# Patient Record
Sex: Female | Born: 1955
Health system: Southern US, Community
[De-identification: ages and names within clinical notes are randomized; demographics above are authoritative.]

## PROBLEM LIST (undated history)

## (undated) DIAGNOSIS — I10 Essential (primary) hypertension: Secondary | ICD-10-CM

## (undated) DIAGNOSIS — R002 Palpitations: Secondary | ICD-10-CM

## (undated) DIAGNOSIS — Z860100 Personal history of colon polyps, unspecified: Secondary | ICD-10-CM

## (undated) DIAGNOSIS — R011 Cardiac murmur, unspecified: Secondary | ICD-10-CM

## (undated) DIAGNOSIS — Z800 Family history of malignant neoplasm of digestive organs: Secondary | ICD-10-CM

## (undated) DIAGNOSIS — Z9289 Personal history of other medical treatment: Secondary | ICD-10-CM

## (undated) DIAGNOSIS — E785 Hyperlipidemia, unspecified: Secondary | ICD-10-CM

## (undated) DIAGNOSIS — J8410 Pulmonary fibrosis, unspecified: Secondary | ICD-10-CM

## (undated) DIAGNOSIS — E109 Type 1 diabetes mellitus without complications: Secondary | ICD-10-CM

## (undated) DIAGNOSIS — D509 Iron deficiency anemia, unspecified: Secondary | ICD-10-CM

## (undated) HISTORY — DX: Palpitations: R00.2

## (undated) HISTORY — PX: FACIAL COSMETIC SURGERY: SHX629

## (undated) HISTORY — DX: Cardiac murmur, unspecified: R01.1

## (undated) HISTORY — PX: COLONOSCOPY: SHX174

## (undated) HISTORY — DX: Iron deficiency anemia, unspecified: D50.9

## (undated) HISTORY — DX: Personal history of other medical treatment: Z92.89

## (undated) HISTORY — DX: Type 1 diabetes mellitus without complications: E10.9

## (undated) HISTORY — DX: Pulmonary fibrosis, unspecified: J84.10

## (undated) HISTORY — DX: Hyperlipidemia, unspecified: E78.5

## (undated) HISTORY — PX: POLYPECTOMY: SHX149

## (undated) HISTORY — DX: Family history of malignant neoplasm of digestive organs: Z80.0

## (undated) HISTORY — DX: Essential (primary) hypertension: I10

## (undated) HISTORY — PX: CHOLECYSTECTOMY: SHX55

## (undated) HISTORY — PX: BREAST SURGERY: SHX581

## (undated) HISTORY — DX: Personal history of colon polyps, unspecified: Z86.0100

---

## 2015-12-21 DIAGNOSIS — E785 Hyperlipidemia, unspecified: Secondary | ICD-10-CM | POA: Insufficient documentation

## 2015-12-21 DIAGNOSIS — I10 Essential (primary) hypertension: Secondary | ICD-10-CM

## 2015-12-21 DIAGNOSIS — E109 Type 1 diabetes mellitus without complications: Secondary | ICD-10-CM

## 2015-12-21 HISTORY — DX: Hyperlipidemia, unspecified: E78.5

## 2015-12-21 HISTORY — DX: Essential (primary) hypertension: I10

## 2015-12-21 HISTORY — DX: Type 1 diabetes mellitus without complications: E10.9

## 2016-01-07 DIAGNOSIS — E119 Type 2 diabetes mellitus without complications: Secondary | ICD-10-CM | POA: Diagnosis not present

## 2016-01-27 DIAGNOSIS — E109 Type 1 diabetes mellitus without complications: Secondary | ICD-10-CM | POA: Diagnosis not present

## 2016-03-18 DIAGNOSIS — E109 Type 1 diabetes mellitus without complications: Secondary | ICD-10-CM | POA: Diagnosis not present

## 2016-04-20 DIAGNOSIS — E109 Type 1 diabetes mellitus without complications: Secondary | ICD-10-CM | POA: Diagnosis not present

## 2016-04-20 DIAGNOSIS — Z794 Long term (current) use of insulin: Secondary | ICD-10-CM | POA: Diagnosis not present

## 2016-04-20 DIAGNOSIS — Z9641 Presence of insulin pump (external) (internal): Secondary | ICD-10-CM | POA: Diagnosis not present

## 2016-06-22 DIAGNOSIS — E109 Type 1 diabetes mellitus without complications: Secondary | ICD-10-CM | POA: Diagnosis not present

## 2016-06-22 DIAGNOSIS — E785 Hyperlipidemia, unspecified: Secondary | ICD-10-CM | POA: Diagnosis not present

## 2016-06-22 DIAGNOSIS — I10 Essential (primary) hypertension: Secondary | ICD-10-CM | POA: Diagnosis not present

## 2016-07-01 DIAGNOSIS — Z23 Encounter for immunization: Secondary | ICD-10-CM | POA: Diagnosis not present

## 2016-07-27 DIAGNOSIS — E109 Type 1 diabetes mellitus without complications: Secondary | ICD-10-CM | POA: Diagnosis not present

## 2016-07-27 DIAGNOSIS — Z9641 Presence of insulin pump (external) (internal): Secondary | ICD-10-CM | POA: Diagnosis not present

## 2016-07-27 DIAGNOSIS — Z794 Long term (current) use of insulin: Secondary | ICD-10-CM | POA: Diagnosis not present

## 2016-10-23 DIAGNOSIS — A088 Other specified intestinal infections: Secondary | ICD-10-CM | POA: Diagnosis not present

## 2016-11-28 DIAGNOSIS — Z8619 Personal history of other infectious and parasitic diseases: Secondary | ICD-10-CM | POA: Diagnosis not present

## 2016-11-28 DIAGNOSIS — R413 Other amnesia: Secondary | ICD-10-CM | POA: Diagnosis not present

## 2016-11-28 DIAGNOSIS — Z79899 Other long term (current) drug therapy: Secondary | ICD-10-CM | POA: Diagnosis not present

## 2016-11-28 DIAGNOSIS — E559 Vitamin D deficiency, unspecified: Secondary | ICD-10-CM | POA: Diagnosis not present

## 2016-11-28 DIAGNOSIS — E785 Hyperlipidemia, unspecified: Secondary | ICD-10-CM | POA: Diagnosis not present

## 2016-11-28 DIAGNOSIS — Z0001 Encounter for general adult medical examination with abnormal findings: Secondary | ICD-10-CM | POA: Diagnosis not present

## 2016-11-28 DIAGNOSIS — R110 Nausea: Secondary | ICD-10-CM | POA: Diagnosis not present

## 2016-12-02 DIAGNOSIS — Z794 Long term (current) use of insulin: Secondary | ICD-10-CM | POA: Diagnosis not present

## 2016-12-02 DIAGNOSIS — Z9641 Presence of insulin pump (external) (internal): Secondary | ICD-10-CM | POA: Diagnosis not present

## 2016-12-02 DIAGNOSIS — E109 Type 1 diabetes mellitus without complications: Secondary | ICD-10-CM | POA: Diagnosis not present

## 2016-12-05 DIAGNOSIS — Z9641 Presence of insulin pump (external) (internal): Secondary | ICD-10-CM | POA: Diagnosis not present

## 2016-12-05 DIAGNOSIS — Z794 Long term (current) use of insulin: Secondary | ICD-10-CM | POA: Diagnosis not present

## 2016-12-05 DIAGNOSIS — E109 Type 1 diabetes mellitus without complications: Secondary | ICD-10-CM | POA: Diagnosis not present

## 2016-12-05 DIAGNOSIS — Z8619 Personal history of other infectious and parasitic diseases: Secondary | ICD-10-CM | POA: Diagnosis not present

## 2016-12-07 DIAGNOSIS — Z1231 Encounter for screening mammogram for malignant neoplasm of breast: Secondary | ICD-10-CM | POA: Diagnosis not present

## 2016-12-19 DIAGNOSIS — I10 Essential (primary) hypertension: Secondary | ICD-10-CM | POA: Diagnosis not present

## 2016-12-19 DIAGNOSIS — E785 Hyperlipidemia, unspecified: Secondary | ICD-10-CM | POA: Diagnosis not present

## 2016-12-19 DIAGNOSIS — E109 Type 1 diabetes mellitus without complications: Secondary | ICD-10-CM | POA: Diagnosis not present

## 2017-01-02 DIAGNOSIS — R002 Palpitations: Secondary | ICD-10-CM

## 2017-01-02 HISTORY — DX: Palpitations: R00.2

## 2017-01-03 DIAGNOSIS — Z800 Family history of malignant neoplasm of digestive organs: Secondary | ICD-10-CM | POA: Diagnosis not present

## 2017-01-03 DIAGNOSIS — Z6825 Body mass index (BMI) 25.0-25.9, adult: Secondary | ICD-10-CM | POA: Diagnosis not present

## 2017-01-03 DIAGNOSIS — I341 Nonrheumatic mitral (valve) prolapse: Secondary | ICD-10-CM | POA: Diagnosis not present

## 2017-01-03 DIAGNOSIS — Z1211 Encounter for screening for malignant neoplasm of colon: Secondary | ICD-10-CM | POA: Diagnosis not present

## 2017-01-03 DIAGNOSIS — R002 Palpitations: Secondary | ICD-10-CM | POA: Diagnosis not present

## 2017-01-03 DIAGNOSIS — Z01818 Encounter for other preprocedural examination: Secondary | ICD-10-CM | POA: Diagnosis not present

## 2017-01-03 DIAGNOSIS — I10 Essential (primary) hypertension: Secondary | ICD-10-CM | POA: Diagnosis not present

## 2017-01-10 DIAGNOSIS — E119 Type 2 diabetes mellitus without complications: Secondary | ICD-10-CM | POA: Diagnosis not present

## 2017-01-25 DIAGNOSIS — Z800 Family history of malignant neoplasm of digestive organs: Secondary | ICD-10-CM | POA: Diagnosis not present

## 2017-01-25 DIAGNOSIS — Z79899 Other long term (current) drug therapy: Secondary | ICD-10-CM | POA: Diagnosis not present

## 2017-01-25 DIAGNOSIS — Z1211 Encounter for screening for malignant neoplasm of colon: Secondary | ICD-10-CM | POA: Diagnosis not present

## 2017-01-25 DIAGNOSIS — I341 Nonrheumatic mitral (valve) prolapse: Secondary | ICD-10-CM | POA: Diagnosis not present

## 2017-01-25 DIAGNOSIS — E785 Hyperlipidemia, unspecified: Secondary | ICD-10-CM | POA: Diagnosis not present

## 2017-01-25 DIAGNOSIS — Z794 Long term (current) use of insulin: Secondary | ICD-10-CM | POA: Diagnosis not present

## 2017-01-25 DIAGNOSIS — K635 Polyp of colon: Secondary | ICD-10-CM | POA: Diagnosis not present

## 2017-01-25 DIAGNOSIS — K648 Other hemorrhoids: Secondary | ICD-10-CM | POA: Diagnosis not present

## 2017-01-25 DIAGNOSIS — D125 Benign neoplasm of sigmoid colon: Secondary | ICD-10-CM | POA: Diagnosis not present

## 2017-01-25 DIAGNOSIS — I10 Essential (primary) hypertension: Secondary | ICD-10-CM | POA: Diagnosis not present

## 2017-01-25 DIAGNOSIS — K649 Unspecified hemorrhoids: Secondary | ICD-10-CM | POA: Diagnosis not present

## 2017-01-25 DIAGNOSIS — E119 Type 2 diabetes mellitus without complications: Secondary | ICD-10-CM | POA: Diagnosis not present

## 2017-03-06 DIAGNOSIS — E785 Hyperlipidemia, unspecified: Secondary | ICD-10-CM | POA: Diagnosis not present

## 2017-03-06 DIAGNOSIS — Z79899 Other long term (current) drug therapy: Secondary | ICD-10-CM | POA: Diagnosis not present

## 2017-03-27 DIAGNOSIS — E109 Type 1 diabetes mellitus without complications: Secondary | ICD-10-CM | POA: Diagnosis not present

## 2017-03-27 DIAGNOSIS — I10 Essential (primary) hypertension: Secondary | ICD-10-CM | POA: Diagnosis not present

## 2017-03-27 DIAGNOSIS — E785 Hyperlipidemia, unspecified: Secondary | ICD-10-CM | POA: Diagnosis not present

## 2017-04-26 DIAGNOSIS — Z794 Long term (current) use of insulin: Secondary | ICD-10-CM | POA: Diagnosis not present

## 2017-04-26 DIAGNOSIS — Z9641 Presence of insulin pump (external) (internal): Secondary | ICD-10-CM | POA: Diagnosis not present

## 2017-04-26 DIAGNOSIS — E109 Type 1 diabetes mellitus without complications: Secondary | ICD-10-CM | POA: Diagnosis not present

## 2017-07-28 DIAGNOSIS — E109 Type 1 diabetes mellitus without complications: Secondary | ICD-10-CM | POA: Diagnosis not present

## 2017-07-29 DIAGNOSIS — E109 Type 1 diabetes mellitus without complications: Secondary | ICD-10-CM | POA: Diagnosis not present

## 2017-07-31 DIAGNOSIS — E109 Type 1 diabetes mellitus without complications: Secondary | ICD-10-CM | POA: Diagnosis not present

## 2017-07-31 DIAGNOSIS — Z794 Long term (current) use of insulin: Secondary | ICD-10-CM | POA: Diagnosis not present

## 2017-07-31 DIAGNOSIS — Z9641 Presence of insulin pump (external) (internal): Secondary | ICD-10-CM | POA: Diagnosis not present

## 2017-08-10 DIAGNOSIS — E109 Type 1 diabetes mellitus without complications: Secondary | ICD-10-CM | POA: Diagnosis not present

## 2017-09-20 DIAGNOSIS — I10 Essential (primary) hypertension: Secondary | ICD-10-CM | POA: Diagnosis not present

## 2017-09-20 DIAGNOSIS — E785 Hyperlipidemia, unspecified: Secondary | ICD-10-CM | POA: Diagnosis not present

## 2017-09-20 DIAGNOSIS — E119 Type 2 diabetes mellitus without complications: Secondary | ICD-10-CM | POA: Diagnosis not present

## 2017-10-13 DIAGNOSIS — E109 Type 1 diabetes mellitus without complications: Secondary | ICD-10-CM | POA: Diagnosis not present

## 2017-11-29 DIAGNOSIS — Z0001 Encounter for general adult medical examination with abnormal findings: Secondary | ICD-10-CM | POA: Diagnosis not present

## 2017-11-29 DIAGNOSIS — E559 Vitamin D deficiency, unspecified: Secondary | ICD-10-CM | POA: Diagnosis not present

## 2017-11-29 DIAGNOSIS — Z1331 Encounter for screening for depression: Secondary | ICD-10-CM | POA: Diagnosis not present

## 2017-11-29 DIAGNOSIS — E109 Type 1 diabetes mellitus without complications: Secondary | ICD-10-CM | POA: Diagnosis not present

## 2017-11-29 DIAGNOSIS — R413 Other amnesia: Secondary | ICD-10-CM | POA: Diagnosis not present

## 2017-12-03 DIAGNOSIS — Z23 Encounter for immunization: Secondary | ICD-10-CM | POA: Diagnosis not present

## 2017-12-03 DIAGNOSIS — J018 Other acute sinusitis: Secondary | ICD-10-CM | POA: Diagnosis not present

## 2017-12-04 NOTE — Progress Notes (Signed)
Cardiology Office Note:    Date:  12/06/2017   ID:  Erica Small, DOB 1956/01/13, MRN [id number]  PCP:  Ernestene Kiel, MD  Cardiologist:  Shirlee More, MD    Referring MD: No ref. provider found    ASSESSMENT:    1. Palpitation   2. Mitral valve prolapse   3. Benign essential HTN    PLAN:    In order of problems listed above:  1. She will avoid over-the-counter proarrhythmic drugs if symptoms worsen we will arm her with a event monitor at this time I would not put her on suppressant treatment. 2. Stable no murmur on exam 3. Blood pressure is borderline I asked her to become proactive check her blood pressure at home and contact me if her systolics are greater than 814 diastolic greater than 90 and at that point I would put her on a combination ARB thiazide diuretic   Next appointment: One year    Medication Adjustments/Labs and Tests Ordered: Current medicines are reviewed at length with the patient today.  Concerns regarding medicines are outlined above.  Orders Placed This Encounter  Procedures  . EKG 12-Lead   No orders of the defined types were placed in this encounter.   Chief Complaint  Patient presents with  . Follow-up    1 year follow up appt     History of Present Illness:    Erica Small is a 62 y.o. female with a hx of MVP, palpitation and hypertension  last seen 01/03/17. Compliance with diet, lifestyle and medications: Yes She is a respiratory infection has been taking over-the-counter NyQuil and notices more palpitation but not severe sustained I gave her a list of proarrhythmic over-the-counter medications and asked her to avoid them.  If symptoms worsen an option of an ambulatory event monitor to decide if she needs suppressant treatment.  She has had no chest pain shortness of breath palpitation or syncope. Past Medical History:  Diagnosis Date  . Benign essential HTN 12/21/2015  . Hyperlipidemia 12/21/2015  . Palpitation 01/02/2017  . Type 1  diabetes mellitus without complication ([place]) 4/81/8563    Past Surgical History:  Procedure Laterality Date  . BREAST SURGERY    . CHOLECYSTECTOMY      Current Medications: Current Meds  Medication Sig  . amLODipine (NORVASC) 5 MG tablet Take 5 mg by mouth daily.  Marland Kitchen atorvastatin (LIPITOR) 40 MG tablet Take 40 mg by mouth daily.  Marland Kitchen escitalopram (LEXAPRO) 10 MG tablet Take 10 mg by mouth daily.     Allergies:   Patient has no known allergies.   Social History   Socioeconomic History  . Marital status: Married    Spouse name: None  . Number of children: None  . Years of education: None  . Highest education level: None  Social Needs  . Financial resource strain: None  . Food insecurity - worry: None  . Food insecurity - inability: None  . Transportation needs - medical: None  . Transportation needs - non-medical: None  Occupational History  . None  Tobacco Use  . Smoking status: Never Smoker  . Smokeless tobacco: Never Used  Substance and Sexual Activity  . Alcohol use: Yes  . Drug use: No  . Sexual activity: None  Other Topics Concern  . None  Social History Narrative  . None     Family History: The patient's family history includes Colon cancer in her mother; Heart attack in her brother and father; Hyperlipidemia in her sister; Hypertension  in her mother and sister; Stroke in her mother. ROS:   Please see the history of present illness.    All other systems reviewed and are negative.  EKGs/Labs/Other Studies Reviewed:    The following studies were reviewed today:  EKG:  EKG ordered today.  The ekg ordered today demonstrates sinus rhythm normal no arrhythmia  Recent Labs: No results found for requested labs within last 8760 hours.  Recent Lipid Panel No results found for: CHOL, TRIG, HDL, CHOLHDL, VLDL, LDLCALC, LDLDIRECT  Physical Exam:    VS:  BP 130/88   Pulse 81   Ht 5' 6.75" (1.695 m)   Wt 172 lb (78 kg)   SpO2 99%   BMI 27.14 kg/m     Wt  Readings from Last 3 Encounters:  12/06/17 172 lb (78 kg)     GEN:  Well nourished, well developed in no acute distress HEENT: Normal NECK: No JVD; No carotid bruits LYMPHATICS: No lymphadenopathy CARDIAC: No click or murmur RRR, no murmurs, rubs, gallops RESPIRATORY:  Clear to auscultation without rales, wheezing or rhonchi  ABDOMEN: Soft, non-tender, non-distended MUSCULOSKELETAL:  No edema; No deformity  SKIN: Warm and dry NEUROLOGIC:  Alert and oriented x 3 PSYCHIATRIC:  Normal affect    Signed, Shirlee More, MD  12/06/2017 12:54 PM    [facility]

## 2017-12-06 ENCOUNTER — Ambulatory Visit: Payer: BLUE CROSS/BLUE SHIELD | Admitting: Cardiology

## 2017-12-06 ENCOUNTER — Encounter: Payer: Self-pay | Admitting: Cardiology

## 2017-12-06 VITALS — BP 130/88 | HR 81 | Ht 66.75 in | Wt 172.0 lb

## 2017-12-06 DIAGNOSIS — R002 Palpitations: Secondary | ICD-10-CM | POA: Diagnosis not present

## 2017-12-06 DIAGNOSIS — I10 Essential (primary) hypertension: Secondary | ICD-10-CM | POA: Diagnosis not present

## 2017-12-06 DIAGNOSIS — I341 Nonrheumatic mitral (valve) prolapse: Secondary | ICD-10-CM

## 2017-12-06 HISTORY — DX: Nonrheumatic mitral (valve) prolapse: I34.1

## 2017-12-06 NOTE — Patient Instructions (Addendum)
Medication Instructions:  Your physician recommends that you continue on your current medications as directed. Please refer to the Current Medication list given to you today.   Labwork: None  Testing/Procedures: You had an EKG today.   Follow-Up: Your physician wants you to follow-up in: 1 year. You will receive a reminder letter in the mail two months in advance. If you don't receive a letter, please call our office to schedule the follow-up appointment.   Any Other Special Instructions Will Be Listed Below (If Applicable).     If you need a refill on your cardiac medications before your next appointment, please call your pharmacy.    1. Avoid all over-the-counter antihistamines except Claritin/Loratadine and Zyrtec/Cetrizine. 2. Avoid all combination including cold sinus allergies flu decongestant and sleep medications 3. You can use Robitussin DM Mucinex and Mucinex DM for cough. 4. can use Tylenol aspirin ibuprofen and naproxen but no combinations such as sleep or sinus.  Check your BP twice weekly Goal < 509 systolic   Hypertension Hypertension is another name for high blood pressure. High blood pressure forces your heart to work harder to pump blood. This can cause problems over time. There are two numbers in a blood pressure reading. There is a top number (systolic) over a bottom number (diastolic). It is best to have a blood pressure below 120/80. Healthy choices can help lower your blood pressure. You may need medicine to help lower your blood pressure if:  Your blood pressure cannot be lowered with healthy choices.  Your blood pressure is higher than 130/80.  Follow these instructions at home: Eating and drinking  If directed, follow the DASH eating plan. This diet includes: ? Filling half of your plate at each meal with fruits and vegetables. ? Filling one quarter of your plate at each meal with whole grains. Whole grains include whole wheat pasta, brown rice,  and whole grain bread. ? Eating or drinking low-fat dairy products, such as skim milk or low-fat yogurt. ? Filling one quarter of your plate at each meal with low-fat (lean) proteins. Low-fat proteins include fish, skinless chicken, eggs, beans, and tofu. ? Avoiding fatty meat, cured and processed meat, or chicken with skin. ? Avoiding premade or processed food.  Eat less than 1,500 mg of salt (sodium) a day.  Limit alcohol use to no more than 1 drink a day for nonpregnant women and 2 drinks a day for men. One drink equals 12 oz of beer, 5 oz of wine, or 1 oz of hard liquor. Lifestyle  Work with your doctor to stay at a healthy weight or to lose weight. Ask your doctor what the best weight is for you.  Get at least 30 minutes of exercise that causes your heart to beat faster (aerobic exercise) most days of the week. This may include walking, swimming, or biking.  Get at least 30 minutes of exercise that strengthens your muscles (resistance exercise) at least 3 days a week. This may include lifting weights or pilates.  Do not use any products that contain nicotine or tobacco. This includes cigarettes and e-cigarettes. If you need help quitting, ask your doctor.  Check your blood pressure at home as told by your doctor.  Keep all follow-up visits as told by your doctor. This is important. Medicines  Take over-the-counter and prescription medicines only as told by your doctor. Follow directions carefully.  Do not skip doses of blood pressure medicine. The medicine does not work as well if you skip  doses. Skipping doses also puts you at risk for problems.  Ask your doctor about side effects or reactions to medicines that you should watch for. Contact a doctor if:  You think you are having a reaction to the medicine you are taking.  You have headaches that keep coming back (recurring).  You feel dizzy.  You have swelling in your ankles.  You have trouble with your vision. Get help  right away if:  You get a very bad headache.  You start to feel confused.  You feel weak or numb.  You feel faint.  You get very bad pain in your: ? Chest. ? Belly (abdomen).  You throw up (vomit) more than once.  You have trouble breathing. Summary  Hypertension is another name for high blood pressure.  Making healthy choices can help lower blood pressure. If your blood pressure cannot be controlled with healthy choices, you may need to take medicine. This information is not intended to replace advice given to you by your health care provider. Make sure you discuss any questions you have with your health care provider. Document Released: 03/07/2008 Document Revised: 08/17/2016 Document Reviewed: 08/17/2016 Elsevier Interactive Patient Education  [employer].

## 2017-12-22 DIAGNOSIS — R413 Other amnesia: Secondary | ICD-10-CM | POA: Diagnosis not present

## 2017-12-29 DIAGNOSIS — M85851 Other specified disorders of bone density and structure, right thigh: Secondary | ICD-10-CM | POA: Diagnosis not present

## 2017-12-29 DIAGNOSIS — M8580 Other specified disorders of bone density and structure, unspecified site: Secondary | ICD-10-CM | POA: Diagnosis not present

## 2017-12-29 DIAGNOSIS — Z1231 Encounter for screening mammogram for malignant neoplasm of breast: Secondary | ICD-10-CM | POA: Diagnosis not present

## 2017-12-29 DIAGNOSIS — E2839 Other primary ovarian failure: Secondary | ICD-10-CM | POA: Diagnosis not present

## 2018-01-10 DIAGNOSIS — E109 Type 1 diabetes mellitus without complications: Secondary | ICD-10-CM | POA: Diagnosis not present

## 2018-01-11 DIAGNOSIS — E119 Type 2 diabetes mellitus without complications: Secondary | ICD-10-CM | POA: Diagnosis not present

## 2018-01-15 DIAGNOSIS — E109 Type 1 diabetes mellitus without complications: Secondary | ICD-10-CM | POA: Diagnosis not present

## 2018-01-19 DIAGNOSIS — E785 Hyperlipidemia, unspecified: Secondary | ICD-10-CM | POA: Diagnosis not present

## 2018-01-19 DIAGNOSIS — I10 Essential (primary) hypertension: Secondary | ICD-10-CM | POA: Diagnosis not present

## 2018-01-19 DIAGNOSIS — E109 Type 1 diabetes mellitus without complications: Secondary | ICD-10-CM | POA: Diagnosis not present

## 2018-03-21 DIAGNOSIS — H66003 Acute suppurative otitis media without spontaneous rupture of ear drum, bilateral: Secondary | ICD-10-CM | POA: Diagnosis not present

## 2018-03-21 DIAGNOSIS — Z0131 Encounter for examination of blood pressure with abnormal findings: Secondary | ICD-10-CM | POA: Diagnosis not present

## 2018-03-21 DIAGNOSIS — Z0289 Encounter for other administrative examinations: Secondary | ICD-10-CM | POA: Diagnosis not present

## 2018-03-22 DIAGNOSIS — E109 Type 1 diabetes mellitus without complications: Secondary | ICD-10-CM | POA: Diagnosis not present

## 2018-03-22 DIAGNOSIS — I10 Essential (primary) hypertension: Secondary | ICD-10-CM | POA: Diagnosis not present

## 2018-03-22 DIAGNOSIS — E785 Hyperlipidemia, unspecified: Secondary | ICD-10-CM | POA: Diagnosis not present

## 2018-04-11 DIAGNOSIS — E109 Type 1 diabetes mellitus without complications: Secondary | ICD-10-CM | POA: Diagnosis not present

## 2018-05-29 DIAGNOSIS — I10 Essential (primary) hypertension: Secondary | ICD-10-CM | POA: Diagnosis not present

## 2018-05-29 DIAGNOSIS — E785 Hyperlipidemia, unspecified: Secondary | ICD-10-CM | POA: Diagnosis not present

## 2018-05-29 DIAGNOSIS — Z79899 Other long term (current) drug therapy: Secondary | ICD-10-CM | POA: Diagnosis not present

## 2018-05-29 DIAGNOSIS — R5383 Other fatigue: Secondary | ICD-10-CM | POA: Diagnosis not present

## 2018-05-29 DIAGNOSIS — E109 Type 1 diabetes mellitus without complications: Secondary | ICD-10-CM | POA: Diagnosis not present

## 2018-06-28 DIAGNOSIS — Z794 Long term (current) use of insulin: Secondary | ICD-10-CM | POA: Diagnosis not present

## 2018-06-28 DIAGNOSIS — E1165 Type 2 diabetes mellitus with hyperglycemia: Secondary | ICD-10-CM | POA: Diagnosis not present

## 2018-06-28 DIAGNOSIS — I10 Essential (primary) hypertension: Secondary | ICD-10-CM | POA: Diagnosis not present

## 2018-06-28 DIAGNOSIS — E785 Hyperlipidemia, unspecified: Secondary | ICD-10-CM | POA: Diagnosis not present

## 2018-08-24 DIAGNOSIS — E785 Hyperlipidemia, unspecified: Secondary | ICD-10-CM | POA: Diagnosis not present

## 2018-08-24 DIAGNOSIS — Z79899 Other long term (current) drug therapy: Secondary | ICD-10-CM | POA: Diagnosis not present

## 2018-10-11 DIAGNOSIS — E109 Type 1 diabetes mellitus without complications: Secondary | ICD-10-CM | POA: Diagnosis not present

## 2018-10-29 DIAGNOSIS — I10 Essential (primary) hypertension: Secondary | ICD-10-CM | POA: Diagnosis not present

## 2018-10-29 DIAGNOSIS — E785 Hyperlipidemia, unspecified: Secondary | ICD-10-CM | POA: Diagnosis not present

## 2018-10-29 DIAGNOSIS — E109 Type 1 diabetes mellitus without complications: Secondary | ICD-10-CM | POA: Diagnosis not present

## 2018-12-03 DIAGNOSIS — Z124 Encounter for screening for malignant neoplasm of cervix: Secondary | ICD-10-CM | POA: Diagnosis not present

## 2018-12-03 DIAGNOSIS — Z1331 Encounter for screening for depression: Secondary | ICD-10-CM | POA: Diagnosis not present

## 2018-12-03 DIAGNOSIS — Z0000 Encounter for general adult medical examination without abnormal findings: Secondary | ICD-10-CM | POA: Diagnosis not present

## 2018-12-03 DIAGNOSIS — E109 Type 1 diabetes mellitus without complications: Secondary | ICD-10-CM | POA: Diagnosis not present

## 2018-12-03 DIAGNOSIS — E785 Hyperlipidemia, unspecified: Secondary | ICD-10-CM | POA: Diagnosis not present

## 2019-01-04 ENCOUNTER — Telehealth: Payer: Self-pay | Admitting: Cardiology

## 2019-01-04 NOTE — Telephone Encounter (Signed)
Left message for patient to call office for poss TELEVISIT and may need to change appt time.Erica Small

## 2019-01-04 NOTE — Telephone Encounter (Signed)
Per Edmon Crape (sent via staff message): Patient declined to do televisit and rescheduled appt til 08/26,patient was informed to call our officei if having any issues and have pharmacy call us for refills.

## 2019-01-14 DIAGNOSIS — E119 Type 2 diabetes mellitus without complications: Secondary | ICD-10-CM | POA: Diagnosis not present

## 2019-01-16 ENCOUNTER — Telehealth: Payer: BLUE CROSS/BLUE SHIELD | Admitting: Cardiology

## 2019-01-28 DIAGNOSIS — E109 Type 1 diabetes mellitus without complications: Secondary | ICD-10-CM | POA: Diagnosis not present

## 2019-03-06 DIAGNOSIS — E785 Hyperlipidemia, unspecified: Secondary | ICD-10-CM | POA: Diagnosis not present

## 2019-03-30 DIAGNOSIS — Z1231 Encounter for screening mammogram for malignant neoplasm of breast: Secondary | ICD-10-CM | POA: Diagnosis not present

## 2019-04-03 DIAGNOSIS — I10 Essential (primary) hypertension: Secondary | ICD-10-CM | POA: Diagnosis not present

## 2019-04-03 DIAGNOSIS — E109 Type 1 diabetes mellitus without complications: Secondary | ICD-10-CM | POA: Diagnosis not present

## 2019-04-03 DIAGNOSIS — E785 Hyperlipidemia, unspecified: Secondary | ICD-10-CM | POA: Diagnosis not present

## 2019-04-03 DIAGNOSIS — Z794 Long term (current) use of insulin: Secondary | ICD-10-CM | POA: Diagnosis not present

## 2019-05-28 NOTE — Progress Notes (Signed)
Cardiology Office Note:    Date:  05/29/2019   ID:  Erica Small, DOB November 29, 1955, MRN [id number]  PCP:  Ernestene Kiel, MD  Cardiologist:  Shirlee More, MD    Referring MD: Ernestene Kiel, MD    ASSESSMENT:    1. Mitral valve prolapse   2. Benign essential HTN    PLAN:    In order of problems listed above:  1. Stable on exam she has no murmur we discussed the option of doing an echocardiogram and neither of [place] feel it is particularly needed at this time she has mild palpitation infrequent not severe sustained and if worsen we could require an ambulatory heart rhythm monitor she will continue avoid over-the-counter proarrhythmic drugs 2. BP not at target I will switch her to a more potent ARB   Next appointment: 1 year   Medication Adjustments/Labs and Tests Ordered: Current medicines are reviewed at length with the patient today.  Concerns regarding medicines are outlined above.  No orders of the defined types were placed in this encounter.  No orders of the defined types were placed in this encounter.   Chief Complaint  Patient presents with  . Follow-up    MVP  . Hypertension    History of Present Illness:    Erica Small is a 63 y.o. female with a hx of MVP, palpitation and hypertension     last seen 12/16/2017. Compliance with diet, lifestyle and medications: Yes  Her diabetes not tightly controlled A1c 6.1 and overall is done well but she is worn out by the fear of COVID-19 and has infrequent not severe not sustained palpitation.  She has no edema shortness of breath chest pain or syncope.  On physical exam there is no murmur of mitral regurgitation echocardiogram is normal Past Medical History:  Diagnosis Date  . Benign essential HTN 12/21/2015  . Hyperlipidemia 12/21/2015  . Palpitation 01/02/2017  . Type 1 diabetes mellitus without complication ([place]) [id number]    Past Surgical History:  Procedure Laterality Date  . BREAST SURGERY    .  CHOLECYSTECTOMY      Current Medications: Current Meds  Medication Sig  . ALPRAZolam (XANAX) 0.25 MG tablet Take 1 tablet by mouth 3 (three) times daily as needed.  Marland Kitchen amLODipine (NORVASC) 5 MG tablet Take 5 mg by mouth daily.  Marland Kitchen aspirin EC 81 MG tablet Take 81 mg by mouth daily.  Marland Kitchen escitalopram (LEXAPRO) 10 MG tablet Take 10 mg by mouth daily.  Marland Kitchen estradiol (ESTRACE) 0.5 MG tablet Take 0.5 tablets by mouth daily.  . Insulin Human (INSULIN PUMP) SOLN Inject into the skin. Novlog Insulin given by pump  . losartan (COZAAR) 100 MG tablet Take 100 mg by mouth daily.  . progesterone (PROMETRIUM) 200 MG capsule Take 200 mg by mouth daily.   . rosuvastatin (CRESTOR) 40 MG tablet Take 40 mg by mouth daily.     Allergies:   Patient has no known allergies.   Social History   Socioeconomic History  . Marital status: Married    Spouse name: Not on file  . Number of children: Not on file  . Years of education: Not on file  . Highest education level: Not on file  Occupational History  . Not on file  Social Needs  . Financial resource strain: Not on file  . Food insecurity    Worry: Not on file    Inability: Not on file  . Transportation needs    Medical: Not on file  Non-medical: Not on file  Tobacco Use  . Smoking status: Never Smoker  . Smokeless tobacco: Never Used  Substance and Sexual Activity  . Alcohol use: Yes  . Drug use: No  . Sexual activity: Not on file  Lifestyle  . Physical activity    Days per week: Not on file    Minutes per session: Not on file  . Stress: Not on file  Relationships  . Social Herbalist on phone: Not on file    Gets together: Not on file    Attends religious service: Not on file    Active member of club or organization: Not on file    Attends meetings of clubs or organizations: Not on file    Relationship status: Not on file  Other Topics Concern  . Not on file  Social History Narrative  . Not on file     Family History: The  patient's family history includes Colon cancer in her mother; Heart attack in her brother and father; Hyperlipidemia in her sister; Hypertension in her mother and sister; Stroke in her mother. ROS:   Please see the history of present illness.    All other systems reviewed and are negative.  EKGs/Labs/Other Studies Reviewed:    The following studies were reviewed today:  EKG:  EKG ordered today and personally reviewed.  The ekg ordered today demonstrates sinus rhythm and normal  Recent Labs: No results found for requested labs within last 8760 hours.  Recent Lipid Panel No results found for: CHOL, TRIG, HDL, CHOLHDL, VLDL, LDLCALC, LDLDIRECT  Physical Exam:    VS:  BP 132/84 (BP Location: Right Arm, Patient Position: Sitting, Cuff Size: Normal)   Pulse 65   Ht 5\' 7"  (1.702 m)   Wt 174 lb (78.9 kg)   SpO2 98%   BMI 27.25 kg/m     Wt Readings from Last 3 Encounters:  05/29/19 174 lb (78.9 kg)  12/06/17 172 lb (78 kg)     GEN:  Well nourished, well developed in no acute distress HEENT: Normal NECK: No JVD; No carotid bruits LYMPHATICS: No lymphadenopathy CARDIAC: No click or murmur RRR, no murmurs, rubs, gallops RESPIRATORY:  Clear to auscultation without rales, wheezing or rhonchi  ABDOMEN: Soft, non-tender, non-distended MUSCULOSKELETAL:  No edema; No deformity  SKIN: Warm and dry NEUROLOGIC:  Alert and oriented x 3 PSYCHIATRIC:  Normal affect    Signed, Shirlee More, MD  05/29/2019 11:40 AM    [place]

## 2019-05-29 ENCOUNTER — Encounter: Payer: Self-pay | Admitting: Cardiology

## 2019-05-29 ENCOUNTER — Ambulatory Visit (INDEPENDENT_AMBULATORY_CARE_PROVIDER_SITE_OTHER): Payer: BC Managed Care – PPO | Admitting: Cardiology

## 2019-05-29 ENCOUNTER — Other Ambulatory Visit: Payer: Self-pay

## 2019-05-29 VITALS — BP 132/84 | HR 65 | Ht 67.0 in | Wt 174.0 lb

## 2019-05-29 DIAGNOSIS — I10 Essential (primary) hypertension: Secondary | ICD-10-CM | POA: Diagnosis not present

## 2019-05-29 DIAGNOSIS — I341 Nonrheumatic mitral (valve) prolapse: Secondary | ICD-10-CM

## 2019-05-29 MED ORDER — TELMISARTAN 20 MG PO TABS: 20.0000 mg | ORAL_TABLET | Freq: Every day | ORAL | 3 refills | Status: DC

## 2019-05-29 NOTE — Patient Instructions (Addendum)
Medication Instructions:  Your physician has recommended you make the following change in your medication:   STOP losartan  START telmisartan (micardis) 20 mg: Take 1 tablet daily   If you need a refill on your cardiac medications before your next appointment, please call your pharmacy.   Lab work: None  If you have labs (blood work) drawn today and your tests are completely normal, you will receive your results only by: Marland Kitchen MyChart Message (if you have MyChart) OR . A paper copy in the mail If you have any lab test that is abnormal or we need to change your treatment, we will call you to review the results.  Testing/Procedures: You had an EKG today.   Follow-Up: At [facility], you and your health needs are our priority.  As part of our continuing mission to provide you with exceptional heart care, we have created designated Provider Care Teams.  These Care Teams include your primary Cardiologist (physician) and Advanced Practice Providers (APPs -  Physician Assistants and Nurse Practitioners) who all work together to provide you with the care you need, when you need it. You will need a follow up appointment in 1 years.  Please call our office 2 months in advance to schedule this appointment.     1. Avoid all over-the-counter antihistamines except Claritin/Loratadine and Zyrtec/Cetrizine. 2. Avoid all combination including cold sinus allergies flu decongestant and sleep medications 3. You can use Robitussin DM Mucinex and Mucinex DM for cough. 4. can use Tylenol aspirin ibuprofen and naproxen but no combinations such as sleep or sinus.1. Avoid all over-the-counter antihistamines except Claritin/Loratadine and Zyrtec/Cetrizine. 2. Avoid all combination including cold sinus allergies flu decongestant and sleep medications 3. You can use Robitussin DM Mucinex and Mucinex DM for cough. 4. can use Tylenol aspirin ibuprofen and naproxen but no combinations such as sleep or sinus.

## 2019-06-12 DIAGNOSIS — I10 Essential (primary) hypertension: Secondary | ICD-10-CM | POA: Diagnosis not present

## 2019-06-12 DIAGNOSIS — E785 Hyperlipidemia, unspecified: Secondary | ICD-10-CM | POA: Diagnosis not present

## 2019-06-12 DIAGNOSIS — Z6827 Body mass index (BMI) 27.0-27.9, adult: Secondary | ICD-10-CM | POA: Diagnosis not present

## 2019-06-12 DIAGNOSIS — Z79899 Other long term (current) drug therapy: Secondary | ICD-10-CM | POA: Diagnosis not present

## 2019-06-13 ENCOUNTER — Encounter: Payer: Self-pay | Admitting: Cardiology

## 2019-07-11 DIAGNOSIS — I10 Essential (primary) hypertension: Secondary | ICD-10-CM | POA: Diagnosis not present

## 2019-07-19 DIAGNOSIS — R002 Palpitations: Secondary | ICD-10-CM | POA: Diagnosis not present

## 2019-07-19 DIAGNOSIS — I10 Essential (primary) hypertension: Secondary | ICD-10-CM | POA: Diagnosis not present

## 2019-07-30 DIAGNOSIS — E109 Type 1 diabetes mellitus without complications: Secondary | ICD-10-CM | POA: Diagnosis not present

## 2019-09-10 DIAGNOSIS — E785 Hyperlipidemia, unspecified: Secondary | ICD-10-CM | POA: Diagnosis not present

## 2019-09-10 DIAGNOSIS — E109 Type 1 diabetes mellitus without complications: Secondary | ICD-10-CM | POA: Diagnosis not present

## 2019-09-10 DIAGNOSIS — I10 Essential (primary) hypertension: Secondary | ICD-10-CM | POA: Diagnosis not present

## 2019-10-21 DIAGNOSIS — H35372 Puckering of macula, left eye: Secondary | ICD-10-CM | POA: Diagnosis not present

## 2019-10-30 DIAGNOSIS — E109 Type 1 diabetes mellitus without complications: Secondary | ICD-10-CM | POA: Diagnosis not present

## 2019-12-13 DIAGNOSIS — Z0000 Encounter for general adult medical examination without abnormal findings: Secondary | ICD-10-CM | POA: Diagnosis not present

## 2019-12-13 DIAGNOSIS — Z6825 Body mass index (BMI) 25.0-25.9, adult: Secondary | ICD-10-CM | POA: Diagnosis not present

## 2019-12-13 DIAGNOSIS — Z1331 Encounter for screening for depression: Secondary | ICD-10-CM | POA: Diagnosis not present

## 2019-12-13 DIAGNOSIS — E785 Hyperlipidemia, unspecified: Secondary | ICD-10-CM | POA: Diagnosis not present

## 2020-01-15 DIAGNOSIS — E109 Type 1 diabetes mellitus without complications: Secondary | ICD-10-CM | POA: Diagnosis not present

## 2020-01-31 DIAGNOSIS — E109 Type 1 diabetes mellitus without complications: Secondary | ICD-10-CM | POA: Diagnosis not present

## 2020-03-10 DIAGNOSIS — Z9641 Presence of insulin pump (external) (internal): Secondary | ICD-10-CM | POA: Diagnosis not present

## 2020-03-10 DIAGNOSIS — I10 Essential (primary) hypertension: Secondary | ICD-10-CM | POA: Diagnosis not present

## 2020-03-10 DIAGNOSIS — E109 Type 1 diabetes mellitus without complications: Secondary | ICD-10-CM | POA: Diagnosis not present

## 2020-03-20 ENCOUNTER — Telehealth: Payer: Self-pay | Admitting: *Deleted

## 2020-03-20 NOTE — Telephone Encounter (Signed)
Erica Small, [place]  This pt is on an insulin pump.  Her PV is 04-01-20 and colonoscopy is 04-14-20.  Would you please reach out to her doctor who manages her pump for instructions for her procedure?   Thanks, [employer]

## 2020-03-30 ENCOUNTER — Telehealth: Payer: Self-pay

## 2020-03-30 NOTE — Telephone Encounter (Signed)
Faxed insulin pump letter to Johnney Ou PA who manages her insulin pump.

## 2020-03-30 NOTE — Telephone Encounter (Signed)
Shanetta Nicolls 12/04/1955 [id number]   Dear Johnney Ou PA    Dr. Lyndel Safe has scheduled the above patient for a(n) Colonoscopy at [facility] on 04/14/20.  Our records show that he/she is on insulin therapy via an insulin pump.  Our colonoscopy prep protocol requires that:  the patient must be on a clear liquid diet the entire day prior to the procedure date as well as the morning of the procedure  the patient must be NPO for 3 to 4 hours prior to the procedure   the patient must consume a PEG 3350 solution to prepare for the procedure.  Please advise [place] of any adjustments that need to be made to the patient's insulin pump therapy prior to the above procedure date.    Please  fax back this completed form to me at [phone] .  If you have any questions, please call me at [phone].  Thank you for your help with this matter.  Sincerely,  Grace Bushy LPN    Physician Recommendation:  ________________________________________________  ________________________________________________________________________  ________________________________________________________________________  ________________________________________________________________________

## 2020-03-31 ENCOUNTER — Telehealth: Payer: Self-pay

## 2020-03-31 NOTE — Telephone Encounter (Signed)
Spoke to patient to inform her that her endocrinologist has cleansed her to run a temp basal with a 50% reduction on her insulin pump. She will start the night prior to her procedure through the time of her procedure, and until she is able to eat again. Patient voiced understanding.

## 2020-04-01 ENCOUNTER — Ambulatory Visit (AMBULATORY_SURGERY_CENTER): Payer: Self-pay | Admitting: *Deleted

## 2020-04-01 ENCOUNTER — Other Ambulatory Visit: Payer: Self-pay

## 2020-04-01 VITALS — Ht 67.0 in | Wt 160.6 lb

## 2020-04-01 DIAGNOSIS — Z800 Family history of malignant neoplasm of digestive organs: Secondary | ICD-10-CM

## 2020-04-01 DIAGNOSIS — Z01818 Encounter for other preprocedural examination: Secondary | ICD-10-CM

## 2020-04-01 DIAGNOSIS — Z86010 Personal history of colonic polyps: Secondary | ICD-10-CM

## 2020-04-01 MED ORDER — SUTAB 1479-225-188 MG PO TABS: 24.0000 | ORAL_TABLET | ORAL | 0 refills | Status: DC

## 2020-04-01 NOTE — Progress Notes (Signed)
No egg or soy allergy known to patient  No issues with past sedation with any surgeries  or procedures, no intubation problems  No diet pills per patient No home 02 use per patient  No blood thinners per patient  Pt denies issues with constipation  No A fib or A flutter   Sutab code to pharmacy and sutab coupon to pt   COVID 19 guidelines implemented in PV today   Due to the COVID-19 pandemic we are asking patients to follow these guidelines. Please only bring one care partner. Please be aware that your care partner may wait in the car in the parking lot or if they feel like they will be too hot to wait in the car, they may wait in the lobby on the 4th floor. All care partners are required to wear a mask the entire time (we do not have any that we can provide them), they need to practice social distancing, and we will do a Covid check for all patient's and care partners when you arrive. Also we will check their temperature and your temperature. If the care partner waits in their car they need to stay in the parking lot the entire time and we will call them on their cell phone when the patient is ready for discharge so they can bring the car to the front of the building. Also all patient's will need to wear a mask into building.

## 2020-04-09 ENCOUNTER — Other Ambulatory Visit: Payer: Self-pay | Admitting: Gastroenterology

## 2020-04-09 ENCOUNTER — Ambulatory Visit (INDEPENDENT_AMBULATORY_CARE_PROVIDER_SITE_OTHER): Payer: BC Managed Care – PPO

## 2020-04-09 ENCOUNTER — Other Ambulatory Visit: Payer: Self-pay

## 2020-04-09 DIAGNOSIS — Z1159 Encounter for screening for other viral diseases: Secondary | ICD-10-CM

## 2020-04-09 LAB — SARS CORONAVIRUS 2 (TAT 6-24 HRS): SARS Coronavirus 2: NEGATIVE

## 2020-04-14 ENCOUNTER — Encounter: Payer: Self-pay | Admitting: Gastroenterology

## 2020-04-14 ENCOUNTER — Other Ambulatory Visit: Payer: Self-pay

## 2020-04-14 ENCOUNTER — Ambulatory Visit (AMBULATORY_SURGERY_CENTER): Payer: BC Managed Care – PPO | Admitting: Gastroenterology

## 2020-04-14 VITALS — BP 118/71 | HR 72 | Temp 97.5°F | Resp 11 | Ht 67.0 in | Wt 160.6 lb

## 2020-04-14 DIAGNOSIS — Z1211 Encounter for screening for malignant neoplasm of colon: Secondary | ICD-10-CM | POA: Diagnosis not present

## 2020-04-14 DIAGNOSIS — Z86010 Personal history of colonic polyps: Secondary | ICD-10-CM | POA: Diagnosis not present

## 2020-04-14 DIAGNOSIS — D123 Benign neoplasm of transverse colon: Secondary | ICD-10-CM | POA: Diagnosis not present

## 2020-04-14 DIAGNOSIS — Z800 Family history of malignant neoplasm of digestive organs: Secondary | ICD-10-CM | POA: Diagnosis not present

## 2020-04-14 MED ORDER — SODIUM CHLORIDE 0.9 % IV SOLN: 500.0000 mL | Freq: Once | INTRAVENOUS | Status: DC

## 2020-04-14 NOTE — Patient Instructions (Addendum)
Handouts on polyps,diverticulosis,and hemorrhoids given to you today  Await pathology results on polyp removed   YOU HAD AN ENDOSCOPIC PROCEDURE TODAY AT [place]:   Refer to the procedure report that was given to you for any specific questions about what was found during the examination.  If the procedure report does not answer your questions, please call your gastroenterologist to clarify.  If you requested that your care partner not be given the details of your procedure findings, then the procedure report has been included in a sealed envelope for you to review at your convenience later.  YOU SHOULD EXPECT: Some feelings of bloating in the abdomen. Passage of more gas than usual.  Walking can help get rid of the air that was put into your GI tract during the procedure and reduce the bloating. If you had a lower endoscopy (such as a colonoscopy or flexible sigmoidoscopy) you may notice spotting of blood in your stool or on the toilet paper. If you underwent a bowel prep for your procedure, you may not have a normal bowel movement for a few days.  Please Note:  You might notice some irritation and congestion in your nose or some drainage.  This is from the oxygen used during your procedure.  There is no need for concern and it should clear up in a day or so.  SYMPTOMS TO REPORT IMMEDIATELY:   Following lower endoscopy (colonoscopy or flexible sigmoidoscopy):  Excessive amounts of blood in the stool  Significant tenderness or worsening of abdominal pains  Swelling of the abdomen that is new, acute  Fever of 100F or higher    For urgent or emergent issues, a gastroenterologist can be reached at any hour by calling [phone]. Do not use MyChart messaging for urgent concerns.    DIET:  We do recommend a small meal at first, but then you may proceed to your regular diet.  Drink plenty of fluids but you should avoid alcoholic beverages for 24 hours.  ACTIVITY:  You  should plan to take it easy for the rest of today and you should NOT DRIVE or use heavy machinery until tomorrow (because of the sedation medicines used during the test).    FOLLOW UP: Our staff will call the number listed on your records 48-72 hours following your procedure to check on you and address any questions or concerns that you may have regarding the information given to you following your procedure. If we do not reach you, we will leave a message.  We will attempt to reach you two times.  During this call, we will ask if you have developed any symptoms of COVID 19. If you develop any symptoms (ie: fever, flu-like symptoms, shortness of breath, cough etc.) before then, please call [phone].  If you test positive for Covid 19 in the 2 weeks post procedure, please call and report this information to [place].    If any biopsies were taken you will be contacted by phone or by letter within the next 1-3 weeks.  Please call us at [phone] if you have not heard about the biopsies in 3 weeks.    SIGNATURES/CONFIDENTIALITY: You and/or your care partner have signed paperwork which will be entered into your electronic medical record.  These signatures attest to the fact that that the information above on your After Visit Summary has been reviewed and is understood.  Full responsibility of the confidentiality of this discharge information lies with you and/or your care-partner.

## 2020-04-14 NOTE — Progress Notes (Signed)
Report to PACU, RN, vss, BBS= Clear.  

## 2020-04-14 NOTE — Progress Notes (Signed)
Pt's states no medical or surgical changes since previsit or office visit.  V/S-CW  CHECK-IN-JB

## 2020-04-14 NOTE — Op Note (Signed)
[facility] Patient Name: Erica Small Procedure Date: 04/14/2020 8:24 AM MRN: [id number] Endoscopist: Jackquline Denmark , MD Age: 64 Referring MD:  Date of Birth: [birth year] Gender: Female Account #: [id number] Procedure:                Colonoscopy Indications:              High risk colon cancer surveillance: Personal                            history of colonic polyps. FH colon cancer (mother                            at age 75, sister at age 15) Medicines:                Monitored Anesthesia Care Procedure:                Pre-Anesthesia Assessment:                           - Prior to the procedure, a History and Physical                            was performed, and patient medications and                            allergies were reviewed. The patient's tolerance of                            previous anesthesia was also reviewed. The risks                            and benefits of the procedure and the sedation                            options and risks were discussed with the patient.                            All questions were answered, and informed consent                            was obtained. Prior Anticoagulants: The patient has                            taken no previous anticoagulant or antiplatelet                            agents. ASA Grade Assessment: II - A patient with                            mild systemic disease. After reviewing the risks                            and benefits, the patient was deemed in  satisfactory condition to undergo the procedure.                           After obtaining informed consent, the colonoscope                            was passed under direct vision. Throughout the                            procedure, the patient's blood pressure, pulse, and                            oxygen saturations were monitored continuously. The                            Colonoscope was introduced through the  anus and                            advanced to the 2 cm into the ileum. The                            colonoscopy was performed without difficulty. The                            patient tolerated the procedure well. The quality                            of the bowel preparation was good. Some retained                            stool in the right colon which we were able to wash                            off well. The terminal ileum, ileocecal valve,                            appendiceal orifice, and rectum were photographed. Scope In: 8:32:40 AM Scope Out: 8:47:20 AM Scope Withdrawal Time: 0 hours 10 minutes 28 seconds  Total Procedure Duration: 0 hours 14 minutes 40 seconds  Findings:                 A 8 mm polyp was found in the proximal transverse                            colon. The polyp was sessile. The polyp was removed                            with a cold snare. Resection and retrieval were                            complete.                           A few small-mouthed  diverticula were found in the                            sigmoid colon.                           Non-bleeding internal hemorrhoids were found during                            retroflexion. The hemorrhoids were small.                           The terminal ileum appeared normal.                           The exam was otherwise without abnormality on                            direct and retroflexion views. Complications:            No immediate complications. Estimated Blood Loss:     Estimated blood loss: none. Impression:               - One 8 mm polyp in the proximal transverse colon,                            removed with a cold snare. Resected and retrieved.                           - Mild sigmoid diverticulosis.                           - Otherwise normal colonoscopy to TI. Recommendation:           - Patient has a contact number available for                            emergencies. The  signs and symptoms of potential                            delayed complications were discussed with the                            patient. Return to normal activities tomorrow.                            Written discharge instructions were provided to the                            patient.                           - Resume previous diet.                           - Continue present medications.                           -  Await pathology results.                           - Repeat colonoscopy in 3 years d/t strong family                            history of colon cancer in 2 first-degree relatives                            for surveillance based on pathology results.                           - Return to GI clinic PRN.                           - D/W Apolonio Schneiders (daughter) Jackquline Denmark, MD 04/14/2020 8:56:49 AM This report has been signed electronically.

## 2020-04-16 ENCOUNTER — Telehealth: Payer: Self-pay

## 2020-04-16 NOTE — Telephone Encounter (Signed)
  Follow up Call-  Call back number 04/14/2020  Post procedure Call Back phone  # [phone]  Permission to leave phone message Yes  Some recent data might be hidden     Patient questions:  Do you have a fever, pain , or abdominal swelling? No. Pain Score  0 *  Have you tolerated food without any problems? Yes.    Have you been able to return to your normal activities? Yes.    Do you have any questions about your discharge instructions: Diet   No. Medications  No. Follow up visit  No.  Do you have questions or concerns about your Care? No.  Actions: * If pain score is 4 or above: No action needed, pain <4.  1. Have you developed a fever since your procedure? no  2.   Have you had an respiratory symptoms (SOB or cough) since your procedure? no  3.   Have you tested positive for COVID 19 since your procedure no  4.   Have you had any family members/close contacts diagnosed with the COVID 19 since your procedure?  no   If yes to any of these questions please route to Joylene John, RN and Erenest Rasher, RN

## 2020-04-19 ENCOUNTER — Encounter: Payer: Self-pay | Admitting: Gastroenterology

## 2020-06-01 DIAGNOSIS — E109 Type 1 diabetes mellitus without complications: Secondary | ICD-10-CM | POA: Diagnosis not present

## 2020-06-02 DIAGNOSIS — R011 Cardiac murmur, unspecified: Secondary | ICD-10-CM | POA: Insufficient documentation

## 2020-06-03 NOTE — Progress Notes (Signed)
Cardiology Office Note:    Date:  06/04/2020   ID:  Erica Small, DOB Mar 31, 1956, MRN [id number]  PCP:  Ernestene Kiel, MD  Cardiologist:  Shirlee More, MD    Referring MD: Ernestene Kiel, MD    ASSESSMENT:    1. Mitral valve prolapse   2. Benign essential HTN   3. Hyperlipidemia, unspecified hyperlipidemia type    PLAN:    In order of problems listed above:  1. Stable no murmur on exam I do not think she needs a repeat echocardiogram 2. BP at target continue treatment ARB and calcium channel blocker 3. Stable continue high intensity statin labs or follow-up with her primary care physician 4. We discussed the benefits of cardiac CT calcium score for risk assessment she will have it done if the score is high I would advise a cardiac CTA as her risk is quite high with type 1 diabetes   Next appointment: 1 year   Medication Adjustments/Labs and Tests Ordered: Current medicines are reviewed at length with the patient today.  Concerns regarding medicines are outlined above.  No orders of the defined types were placed in this encounter.  No orders of the defined types were placed in this encounter.   No chief complaint on file.   History of Present Illness:    Erica Small is a 64 y.o. female with a hx of mitral valve prolapse palpitation and hypertension last seen 05/29/2019.  Compliance with diet, lifestyle and medications: Yes  From a cardiology perspective she is done well tolerates statin without muscle pain or weakness BP at target her diabetic control is much improved using a continuous glucose monitor and has had no cardiovascular symptoms of chest pain shortness of breath palpitation or syncope.  She is quite firm in her conviction not received by respite practices 3 WS I asked her to get a higher quality mask and 95 and start wearing medical eye protection.  I did my best to convince her to accept Covid vaccine. Past Medical History:  Diagnosis Date   Benign  essential HTN 12/21/2015   Heart murmur    MVP   Hyperlipidemia 12/21/2015   Palpitation 01/02/2017   Type 1 diabetes mellitus without complication ([place]) 01/09/8118    Past Surgical History:  Procedure Laterality Date   BREAST SURGERY     CHOLECYSTECTOMY     COLONOSCOPY     FACIAL COSMETIC SURGERY     face lift    POLYPECTOMY      Current Medications: No outpatient medications have been marked as taking for the 06/04/20 encounter (Appointment) with Richardo Priest, MD.     Allergies:   Patient has no known allergies.   Social History   Socioeconomic History   Marital status: Married    Spouse name: Not on file   Number of children: Not on file   Years of education: Not on file   Highest education level: Not on file  Occupational History   Not on file  Tobacco Use   Smoking status: Never Smoker   Smokeless tobacco: Never Used  Vaping Use   Vaping Use: Never used  Substance and Sexual Activity   Alcohol use: Yes    Comment: very rare    Drug use: No   Sexual activity: Not on file  Other Topics Concern   Not on file  Social History Narrative   Not on file   Social Determinants of Health   Financial Resource Strain:    Difficulty of  Paying Living Expenses: Not on file  Food Insecurity:    Worried About [place] in the Last Year: Not on file   Ran Out of Food in the Last Year: Not on file  Transportation Needs:    Lack of Transportation (Medical): Not on file   Lack of Transportation (Non-Medical): Not on file  Physical Activity:    Days of Exercise per Week: Not on file   Minutes of Exercise per Session: Not on file  Stress:    Feeling of Stress : Not on file  Social Connections:    Frequency of Communication with Friends and Family: Not on file   Frequency of Social Gatherings with Friends and Family: Not on file   Attends Religious Services: Not on file   Active Member of Clubs or Organizations: Not on file    Attends Archivist Meetings: Not on file   Marital Status: Not on file     Family History: The patient's family history includes Colon cancer (age of onset: 26) in her sister; Colon cancer (age of onset: 71) in her mother; Colon polyps in her brother, brother, mother, and sister; Heart attack in her brother and father; Hyperlipidemia in her sister; Hypertension in her mother and sister; Stroke in her mother. There is no history of Esophageal cancer, Stomach cancer, or Rectal cancer. ROS:   Please see the history of present illness.    All other systems reviewed and are negative.  EKGs/Labs/Other Studies Reviewed:    The following studies were reviewed today:  EKG:  EKG ordered today and personally reviewed.  The ekg ordered today demonstrates sinus rhythm and is normal  Recent Labs: 12/13/2019 cholesterol 210 last HDL 96 triglycerides 76 A1c 7.6% creatinine normal  Physical Exam:    VS:  There were no vitals taken for this visit.    Wt Readings from Last 3 Encounters:  04/14/20 160 lb 9.6 oz (72.8 kg)  04/01/20 160 lb 9.6 oz (72.8 kg)  05/29/19 174 lb (78.9 kg)     GEN:  Well nourished, well developed in no acute distress HEENT: Normal NECK: No JVD; No carotid bruits LYMPHATICS: No lymphadenopathy CARDIAC: RRR, no murmurs, rubs, gallops RESPIRATORY:  Clear to auscultation without rales, wheezing or rhonchi  ABDOMEN: Soft, non-tender, non-distended MUSCULOSKELETAL:  No edema; No deformity  SKIN: Warm and dry NEUROLOGIC:  Alert and oriented x 3 PSYCHIATRIC:  Normal affect    Signed, Shirlee More, MD  06/04/2020 9:02 AM    [place]

## 2020-06-04 ENCOUNTER — Encounter: Payer: Self-pay | Admitting: Cardiology

## 2020-06-04 ENCOUNTER — Other Ambulatory Visit: Payer: Self-pay

## 2020-06-04 ENCOUNTER — Ambulatory Visit: Payer: BC Managed Care – PPO | Admitting: Cardiology

## 2020-06-04 VITALS — BP 136/82 | HR 76 | Ht 67.0 in | Wt 159.2 lb

## 2020-06-04 DIAGNOSIS — E785 Hyperlipidemia, unspecified: Secondary | ICD-10-CM

## 2020-06-04 DIAGNOSIS — I10 Essential (primary) hypertension: Secondary | ICD-10-CM | POA: Diagnosis not present

## 2020-06-04 DIAGNOSIS — I341 Nonrheumatic mitral (valve) prolapse: Secondary | ICD-10-CM

## 2020-06-04 NOTE — Patient Instructions (Signed)
Medication Instructions:  Your physician recommends that you continue on your current medications as directed. Please refer to the Current Medication list given to you today.  *If you need a refill on your cardiac medications before your next appointment, please call your pharmacy*   Lab Work: None If you have labs (blood work) drawn today and your tests are completely normal, you will receive your results only by: Marland Kitchen MyChart Message (if you have MyChart) OR . A paper copy in the mail If you have any lab test that is abnormal or we need to change your treatment, we will call you to review the results.   Testing/Procedures: We have placed the order for you to have a cardiac CT score done. They will call you to schedule this appointment.    Follow-Up: At [facility], you and your health needs are our priority.  As part of our continuing mission to provide you with exceptional heart care, we have created designated Provider Care Teams.  These Care Teams include your primary Cardiologist (physician) and Advanced Practice Providers (APPs -  Physician Assistants and Nurse Practitioners) who all work together to provide you with the care you need, when you need it.  We recommend signing up for the patient portal called "MyChart".  Sign up information is provided on this After Visit Summary.  MyChart is used to connect with patients for Virtual Visits (Telemedicine).  Patients are able to view lab/test results, encounter notes, upcoming appointments, etc.  Non-urgent messages can be sent to your provider as well.   To learn more about what you can do with MyChart, go to [email or web address].    Your next appointment:   1 year(s)  The format for your next appointment:   In Person  Provider:   Shirlee More, MD   Other Instructions

## 2020-06-29 ENCOUNTER — Telehealth: Payer: Self-pay

## 2020-06-29 ENCOUNTER — Ambulatory Visit (INDEPENDENT_AMBULATORY_CARE_PROVIDER_SITE_OTHER)
Admission: RE | Admit: 2020-06-29 | Discharge: 2020-06-29 | Disposition: A | Discharge status: Home / Self Care | Payer: Self-pay | Source: Ambulatory Visit | Attending: Cardiology | Admitting: Cardiology

## 2020-06-29 ENCOUNTER — Other Ambulatory Visit: Payer: Self-pay

## 2020-06-29 DIAGNOSIS — R931 Abnormal findings on diagnostic imaging of heart and coronary circulation: Secondary | ICD-10-CM

## 2020-06-29 DIAGNOSIS — E785 Hyperlipidemia, unspecified: Secondary | ICD-10-CM

## 2020-06-29 NOTE — Telephone Encounter (Signed)
-----   Message from Richardo Priest, MD sent at 06/29/2020 12:56 PM EDT ----- Coronary artery calcium score was elevated both absolute and percentile for age and sex.  She should continue her statin  I would advise her to have a cardiac CTA performed and we discussed in the office.

## 2020-06-29 NOTE — Telephone Encounter (Signed)
Left message on patients voicemail to please return our call. The following instructions need to be reviewed with the patient for the cardiac CT if patient is agreeable to have this done.   Your cardiac CT will be scheduled at the below location:   [facility] [address] [place], [place] [address] [phone]  If scheduled at [facility], please arrive at the [facility] main entrance of [facility] 30 minutes prior to test start time. Proceed to the [facility] Radiology Department (first floor) to check-in and test prep.   Please follow these instructions carefully (unless otherwise directed):   On the Night Before the Test: . Be sure to Drink plenty of water. . Do not consume any caffeinated/decaffeinated beverages or chocolate 12 hours prior to your test. . Do not take any antihistamines 12 hours prior to your test.  On the Day of the Test: . Drink plenty of water. Do not drink any water within one hour of the test. . Do not eat any food 4 hours prior to the test. . You may take your regular medications prior to the test.  . Take metoprolol (Lopressor) two hours prior to test. . FEMALES- please wear underwire-free bra if available .  After the Test: . Drink plenty of water. . After receiving IV contrast, you may experience a mild flushed feeling. This is normal. . On occasion, you may experience a mild rash up to 24 hours after the test. This is not dangerous. If this occurs, you can take Benadryl 25 mg and increase your fluid intake. . If you experience trouble breathing, this can be serious. If it is severe call 911 IMMEDIATELY. If it is mild, please call our office. . If you take any of these medications: Glipizide/Metformin, Avandament, Glucavance, please do not take 48 hours after completing test unless otherwise instructed.   Once we have confirmed authorization from your insurance company, we will call you to set up a date and time for  your test. Based on how quickly your insurance processes prior authorizations requests, please allow up to 4 weeks to be contacted for scheduling your Cardiac CT appointment. Be advised that routine Cardiac CT appointments could be scheduled as many as 8 weeks after your provider has ordered it.  For non-scheduling related questions, please contact the cardiac imaging nurse navigator should you have any questions/concerns: Marchia Bond, Cardiac Imaging Nurse Navigator Burley Saver, Interim Cardiac Imaging Nurse [facility] Direct Office Dial: [phone]   For scheduling needs, including cancellations and rescheduling, please call Vivien Rota at [phone], option 3.

## 2020-06-30 MED ORDER — METOPROLOL TARTRATE 100 MG PO TABS: 100.0000 mg | ORAL_TABLET | Freq: Once | ORAL | 0 refills | Status: DC

## 2020-06-30 NOTE — Telephone Encounter (Signed)
Spoke with patient regarding results and recommendation.  Patient verbalizes understanding and is agreeable to plan of care. Advised patient to call back with any issues or concerns.   I went over the instructions with the patient for the cardiac CT and she is agreeable to these instructions at this time.    Encouraged patient to call back with any questions or concerns.

## 2020-06-30 NOTE — Addendum Note (Signed)
Addended by: Resa Miner I on: 06/30/2020 10:10 AM   Modules accepted: Orders

## 2020-07-22 ENCOUNTER — Other Ambulatory Visit: Payer: Self-pay | Admitting: *Deleted

## 2020-07-22 ENCOUNTER — Other Ambulatory Visit: Payer: Self-pay

## 2020-07-22 DIAGNOSIS — R011 Cardiac murmur, unspecified: Secondary | ICD-10-CM

## 2020-07-22 DIAGNOSIS — I341 Nonrheumatic mitral (valve) prolapse: Secondary | ICD-10-CM

## 2020-07-22 DIAGNOSIS — I10 Essential (primary) hypertension: Secondary | ICD-10-CM

## 2020-07-23 ENCOUNTER — Telehealth: Payer: Self-pay

## 2020-07-23 LAB — BASIC METABOLIC PANEL
BUN/Creatinine Ratio: 23 (ref 12–28)
BUN: 18 mg/dL (ref 8–27)
CO2: 21 mmol/L (ref 20–29)
Calcium: 9.1 mg/dL (ref 8.7–10.3)
Chloride: 105 mmol/L (ref 96–106)
Creatinine, Ser: 0.77 mg/dL (ref 0.57–1.00)
GFR calc Af Amer: 94 mL/min/{1.73_m2} (ref >59)
GFR calc non Af Amer: 82 mL/min/{1.73_m2} (ref >59)
Glucose: 179 mg/dL — ABNORMAL HIGH (ref 65–99)
Potassium: 4.6 mmol/L (ref 3.5–5.2)
Sodium: 141 mmol/L (ref 134–144)

## 2020-07-23 NOTE — Telephone Encounter (Signed)
Spoke with patient regarding results and recommendation.  Patient verbalizes understanding and is agreeable to plan of care. Advised patient to call back with any issues or concerns.  

## 2020-07-23 NOTE — Telephone Encounter (Signed)
-----   Message from Berniece Salines, DO sent at 07/23/2020 11:35 AM EDT -----   Blood glucose elevated but otherwise normal labs.

## 2020-07-29 DIAGNOSIS — Z20828 Contact with and (suspected) exposure to other viral communicable diseases: Secondary | ICD-10-CM | POA: Diagnosis not present

## 2020-08-04 ENCOUNTER — Telehealth (HOSPITAL_COMMUNITY): Payer: Self-pay | Admitting: Emergency Medicine

## 2020-08-04 ENCOUNTER — Telehealth (HOSPITAL_COMMUNITY): Payer: Self-pay | Admitting: *Deleted

## 2020-08-04 NOTE — Telephone Encounter (Signed)
Pt returning call regarding upcoming cardiac imaging study; pt verbalizes understanding of appt date/time, parking situation and where to check in, pre-test NPO status and medications ordered, and verified current allergies; name and call back number provided for further questions should they arise ° °Dudley Mages Tai RN Navigator Cardiac Imaging °[facility] °[phone] office °[phone] cell ° °

## 2020-08-04 NOTE — Telephone Encounter (Signed)
Attempted to call patient regarding upcoming cardiac CT appointment. °Left message on voicemail with name and callback number °Shai Mckenzie RN Navigator Cardiac Imaging °[facility] °[phone] Office °[phone] Cell ° °

## 2020-08-06 ENCOUNTER — Other Ambulatory Visit: Payer: Self-pay

## 2020-08-06 ENCOUNTER — Ambulatory Visit (HOSPITAL_COMMUNITY)
Admission: RE | Admit: 2020-08-06 | Discharge: 2020-08-06 | Disposition: A | Discharge status: Home / Self Care | Payer: BC Managed Care – PPO | Source: Ambulatory Visit | Attending: Cardiology | Admitting: Cardiology

## 2020-08-06 DIAGNOSIS — I341 Nonrheumatic mitral (valve) prolapse: Secondary | ICD-10-CM | POA: Diagnosis not present

## 2020-08-06 DIAGNOSIS — R931 Abnormal findings on diagnostic imaging of heart and coronary circulation: Secondary | ICD-10-CM | POA: Diagnosis not present

## 2020-08-06 MED ORDER — NITROGLYCERIN 0.4 MG SL SUBL
0.8000 mg | SUBLINGUAL_TABLET | Freq: Once | SUBLINGUAL | Status: AC
  Administered 2020-08-06: 0.8 mg via SUBLINGUAL

## 2020-08-06 MED ORDER — NITROGLYCERIN 0.4 MG SL SUBL
SUBLINGUAL_TABLET | SUBLINGUAL | Status: AC
  Filled 2020-08-06: qty 2

## 2020-08-06 MED ORDER — IOHEXOL 350 MG/ML SOLN
80.0000 mL | Freq: Once | INTRAVENOUS | Status: AC | PRN
  Administered 2020-08-06: 80 mL via INTRAVENOUS

## 2020-08-07 ENCOUNTER — Telehealth: Payer: Self-pay

## 2020-08-07 NOTE — Telephone Encounter (Signed)
Per Dr. Bettina Gavia: Ct results  Good result for her with T1DM   Minimal CAD plaque   Score is high stay on her statin   Left message on patients voicemail to please return our call.

## 2020-08-10 ENCOUNTER — Telehealth: Payer: Self-pay

## 2020-08-10 NOTE — Telephone Encounter (Signed)
Per Dr. Bettina Gavia: CT results  Good result for her with T1DM   Minimal CAD plaque   Score is high stay on her statin   Spoke with patient regarding results and recommendation.  Patient verbalizes understanding and is agreeable to plan of care. Advised patient to call back with any issues or concerns.

## 2020-09-09 DIAGNOSIS — E109 Type 1 diabetes mellitus without complications: Secondary | ICD-10-CM | POA: Diagnosis not present

## 2020-09-09 DIAGNOSIS — I10 Essential (primary) hypertension: Secondary | ICD-10-CM | POA: Diagnosis not present

## 2020-11-30 DIAGNOSIS — I493 Ventricular premature depolarization: Secondary | ICD-10-CM

## 2020-11-30 DIAGNOSIS — Z8679 Personal history of other diseases of the circulatory system: Secondary | ICD-10-CM

## 2020-11-30 DIAGNOSIS — R42 Dizziness and giddiness: Secondary | ICD-10-CM

## 2020-11-30 DIAGNOSIS — R002 Palpitations: Secondary | ICD-10-CM

## 2020-11-30 DIAGNOSIS — I340 Nonrheumatic mitral (valve) insufficiency: Secondary | ICD-10-CM

## 2020-11-30 DIAGNOSIS — I361 Nonrheumatic tricuspid (valve) insufficiency: Secondary | ICD-10-CM

## 2020-11-30 DIAGNOSIS — E119 Type 2 diabetes mellitus without complications: Secondary | ICD-10-CM

## 2020-11-30 HISTORY — DX: Ventricular premature depolarization: I49.3

## 2020-12-01 DIAGNOSIS — Z8679 Personal history of other diseases of the circulatory system: Secondary | ICD-10-CM

## 2020-12-01 DIAGNOSIS — I493 Ventricular premature depolarization: Secondary | ICD-10-CM

## 2020-12-01 DIAGNOSIS — R002 Palpitations: Secondary | ICD-10-CM

## 2020-12-01 DIAGNOSIS — I10 Essential (primary) hypertension: Secondary | ICD-10-CM

## 2020-12-01 DIAGNOSIS — E1169 Type 2 diabetes mellitus with other specified complication: Secondary | ICD-10-CM

## 2020-12-02 DIAGNOSIS — E1169 Type 2 diabetes mellitus with other specified complication: Secondary | ICD-10-CM | POA: Diagnosis not present

## 2020-12-02 DIAGNOSIS — R002 Palpitations: Secondary | ICD-10-CM | POA: Diagnosis not present

## 2020-12-02 DIAGNOSIS — Z8679 Personal history of other diseases of the circulatory system: Secondary | ICD-10-CM | POA: Diagnosis not present

## 2020-12-02 DIAGNOSIS — I10 Essential (primary) hypertension: Secondary | ICD-10-CM | POA: Diagnosis not present

## 2021-01-04 NOTE — Progress Notes (Signed)
Cardiology Office Note:    Date:  01/05/2021   ID:  Erica Small, DOB [birth year], MRN [id number]  PCP:  Ernestene Kiel, MD  Cardiologist:  Shirlee More, MD    Referring MD: Ernestene Kiel, MD    ASSESSMENT:    1. Frequent PVCs   2. Benign essential HTN   3. Elevated coronary artery calcium score   4. Hyperlipidemia, unspecified hyperlipidemia type    PLAN:    In order of problems listed above:  1. No clear-cut precipitant no evidence of significant underlying heart disease she takes no proarrhythmic drugs.  I have offered her reassurance asked her to purchase the alive cor adapter for phone to screen symptomatic episodes and will switch to a more effective antiarrhythmic beta-blocker for relief of symptoms 2. Stable blood pressure at target continue treatment including ARB calcium channel blocker 3. On appropriate therapy she takes low-dose aspirin statin and has minimal coronary atherosclerosis   Next appointment: 3 months   Medication Adjustments/Labs and Tests Ordered: Current medicines are reviewed at length with the patient today.  Concerns regarding medicines are outlined above.  No orders of the defined types were placed in this encounter.  Meds ordered this encounter  Medications  . DISCONTD: acebutolol (SECTRAL) 200 MG capsule    Sig: Take 1 capsule (200 mg total) by mouth 2 (two) times daily.    Dispense:  180 capsule    Refill:  3  . acebutolol (SECTRAL) 200 MG capsule    Sig: Take 1 capsule (200 mg total) by mouth 2 (two) times daily.    Dispense:  60 capsule    Refill:  0    Chief Complaint  Patient presents with  . Hospitalization Follow-up    History of Present Illness:    Erica Small is a 65 y.o. female with a hx of mitral valve prolapse palpitation hypertension type 1 diabetes and hyperlipidemia.  Last seen 06/04/2020.  Following the visit she underwent a coronary calcium score showing a score of 80 80th percentile for age and sex with  calcification left anterior descending and right coronary arteries.  CTA with minimal CAD with less than 25% calcific plaque in the mid right coronary artery [facility] 11/30/2020 records related PVCs EKG showed sinus rhythm with occasional PVCs otherwise normal.  An echocardiogram performed showing EF of 60 to 65% mild left atrial enlargement and moderate mitral regurgitation.  The valve was described as normal.  Laboratory test showed a normal TSH sodium 138 potassium 4.0 creatinine 0.8 GFR greater than 60 cc proBNP normal 171 troponin undetectable magnesium 2.2 hemoglobin 12.1 D-dimer normal for age 48 respiratory panel negative for Covid or influenza chest x-ray read as normal.  Compliance with diet, lifestyle and medications: Yes  She is seen by me in the office she has had waxing and waning of palpitations some improvement with her beta-blocker but not fully and we will switch her to a more effective antiarrhythmic beta-blocker Sectral.  I encouraged her to purchase the iPhone adapter to screen her heart rhythm and she tells me she knows how to read a rhythm strip and what she had in the hospital were symptomatic PVCs.  She is on no proarrhythmic drugs has not had syncope no chest pain shortness of breath. Past Medical History:  Diagnosis Date  . Benign essential HTN 12/21/2015  . Heart murmur    MVP  . Hyperlipidemia 12/21/2015  . Palpitation 01/02/2017  . Type 1 diabetes mellitus without complication ([place]) 9/38/1017  Past Surgical History:  Procedure Laterality Date  . BREAST SURGERY    . CHOLECYSTECTOMY    . COLONOSCOPY    . FACIAL COSMETIC SURGERY     face lift   . POLYPECTOMY      Current Medications: Current Meds  Medication Sig  . Accu-Chek FastClix Lancets MISC Use as directed to check BG 6 times/day.  Dx E10.9  . ALPRAZolam (XANAX) 0.25 MG tablet Take 1 tablet by mouth 3 (three) times daily as needed.   Marland Kitchen amLODipine (NORVASC) 5 MG tablet Take 5 mg by mouth daily.  Marland Kitchen  aspirin EC 81 MG tablet Take 81 mg by mouth daily.  . Cholecalciferol (VITAMIN D) 125 MCG (5000 UT) CAPS Take 5,000 Units by mouth daily.   . Continuous Blood Gluc Sensor (FREESTYLE LIBRE 14 DAY SENSOR) MISC Inject 1 sensor to the skin every 14 days for continuous glucose monitoring.  . Continuous Blood Gluc Sensor (FREESTYLE LIBRE 14 DAY SENSOR) MISC Apply topically.  Marland Kitchen escitalopram (LEXAPRO) 10 MG tablet Take 10 mg by mouth daily.  Marland Kitchen estradiol (ESTRACE) 0.5 MG tablet Take 0.5 tablets by mouth daily.  Marland Kitchen glucose blood (CONTOUR NEXT TEST) test strip Check BG 4 times/day as directed.  Dx E10.9  . Insulin Human (INSULIN PUMP) SOLN Inject into the skin. Novlog Insulin given by pump  . losartan (COZAAR) 100 MG tablet Take 100 mg by mouth daily.  . Multiple Vitamin (MULTIVITAMIN) tablet Take 1 tablet by mouth daily.  Marland Kitchen NOVOLOG 100 UNIT/ML injection Inject into the skin as directed.  Marland Kitchen OLIVE LEAF PO Take by mouth.  Marland Kitchen OVER THE COUNTER MEDICATION Organic Greens BID  . progesterone (PROMETRIUM) 200 MG capsule Take 200 mg by mouth daily.   . rosuvastatin (CRESTOR) 40 MG tablet Take 40 mg by mouth daily.  Marland Kitchen SM MAGNESIUM CITRATE PO Take 500 mg by mouth daily.  . [DISCONTINUED] acebutolol (SECTRAL) 200 MG capsule Take 1 capsule (200 mg total) by mouth 2 (two) times daily.  . [DISCONTINUED] metoprolol succinate (TOPROL-XL) 25 MG 24 hr tablet Take 1 tablet by mouth daily.     Allergies:   Patient has no known allergies.   Social History   Socioeconomic History  . Marital status: Married    Spouse name: Not on file  . Number of children: Not on file  . Years of education: Not on file  . Highest education level: Not on file  Occupational History  . Not on file  Tobacco Use  . Smoking status: Never Smoker  . Smokeless tobacco: Never Used  Vaping Use  . Vaping Use: Never used  Substance and Sexual Activity  . Alcohol use: Yes    Comment: very rare   . Drug use: No  . Sexual activity: Not on file   Other Topics Concern  . Not on file  Social History Narrative  . Not on file   Social Determinants of Health   Financial Resource Strain: Not on file  Food Insecurity: Not on file  Transportation Needs: Not on file  Physical Activity: Not on file  Stress: Not on file  Social Connections: Not on file     Family History: The patient's family history includes Colon cancer (age of onset: 72) in her sister; Colon cancer (age of onset: 45) in her mother; Colon polyps in her brother, brother, mother, and sister; Heart attack in her brother and father; Hyperlipidemia in her sister; Hypertension in her mother and sister; Stroke in her mother. There is  no history of Esophageal cancer, Stomach cancer, or Rectal cancer. ROS:   Please see the history of present illness.    All other systems reviewed and are negative.  EKGs/Labs/Other Studies Reviewed:    The following studies were reviewed today:    Physical Exam:    VS:  BP 116/70 (BP Location: Right Arm, Patient Position: Sitting, Cuff Size: Normal)   Pulse (!) 58   Ht 5\' 7"  (1.702 m)   Wt 156 lb (70.8 kg)   SpO2 98%   BMI 24.43 kg/m     Wt Readings from Last 3 Encounters:  01/05/21 156 lb (70.8 kg)  06/04/20 159 lb 3.2 oz (72.2 kg)  04/14/20 160 lb 9.6 oz (72.8 kg)     GEN:  Well nourished, well developed in no acute distress HEENT: Normal NECK: No JVD; No carotid bruits LYMPHATICS: No lymphadenopathy CARDIAC: \RRR, no murmurs, rubs, gallops she had no extrasystoles during exam RESPIRATORY:  Clear to auscultation without rales, wheezing or rhonchi  ABDOMEN: Soft, non-tender, non-distended MUSCULOSKELETAL:  No edema; No deformity  SKIN: Warm and dry NEUROLOGIC:  Alert and oriented x 3 PSYCHIATRIC:  Normal affect    Signed, Shirlee More, MD  01/05/2021 3:52 PM    [facility]

## 2021-01-05 ENCOUNTER — Ambulatory Visit (INDEPENDENT_AMBULATORY_CARE_PROVIDER_SITE_OTHER): Payer: Medicare Other | Admitting: Cardiology

## 2021-01-05 ENCOUNTER — Encounter: Payer: Self-pay | Admitting: Cardiology

## 2021-01-05 ENCOUNTER — Other Ambulatory Visit: Payer: Self-pay

## 2021-01-05 VITALS — BP 116/70 | HR 58 | Ht 67.0 in | Wt 156.0 lb

## 2021-01-05 DIAGNOSIS — R931 Abnormal findings on diagnostic imaging of heart and coronary circulation: Secondary | ICD-10-CM

## 2021-01-05 DIAGNOSIS — E785 Hyperlipidemia, unspecified: Secondary | ICD-10-CM

## 2021-01-05 DIAGNOSIS — I493 Ventricular premature depolarization: Secondary | ICD-10-CM

## 2021-01-05 DIAGNOSIS — I10 Essential (primary) hypertension: Secondary | ICD-10-CM

## 2021-01-05 MED ORDER — ACEBUTOLOL HCL 200 MG PO CAPS: 200.0000 mg | ORAL_CAPSULE | Freq: Two times a day (BID) | ORAL | 3 refills | Status: DC

## 2021-01-05 MED ORDER — ACEBUTOLOL HCL 200 MG PO CAPS: 200.0000 mg | ORAL_CAPSULE | Freq: Two times a day (BID) | ORAL | 0 refills | Status: DC

## 2021-01-05 NOTE — Patient Instructions (Addendum)
Medication Instructions:  Your physician has recommended you make the following change in your medication:  STOP: TOPROL XL START: Acebutolol 200 mg take one tablet by mouth twice daily.  *If you need a refill on your cardiac medications before your next appointment, please call your pharmacy*   Lab Work: None If you have labs (blood work) drawn today and your tests are completely normal, you will receive your results only by: Marland Kitchen MyChart Message (if you have MyChart) OR . A paper copy in the mail If you have any lab test that is abnormal or we need to change your treatment, we will call you to review the results.   Testing/Procedures: None   Follow-Up: At [facility], you and your health needs are our priority.  As part of our continuing mission to provide you with exceptional heart care, we have created designated Provider Care Teams.  These Care Teams include your primary Cardiologist (physician) and Advanced Practice Providers (APPs -  Physician Assistants and Nurse Practitioners) who all work together to provide you with the care you need, when you need it.  We recommend signing up for the patient portal called "MyChart".  Sign up information is provided on this After Visit Summary.  MyChart is used to connect with patients for Virtual Visits (Telemedicine).  Patients are able to view lab/test results, encounter notes, upcoming appointments, etc.  Non-urgent messages can be sent to your provider as well.   To learn more about what you can do with MyChart, go to [email or web address].    Your next appointment:   6 month(s)  The format for your next appointment:   In Person  Provider:   Shirlee More, MD   Other Instructions 1. Avoid all over-the-counter antihistamines except Claritin/Loratadine and Zyrtec/Cetrizine. 2. Avoid all combination including cold sinus allergies flu decongestant and sleep medications 3. You can use Robitussin DM Mucinex and Mucinex DM for  cough. 4. can use Tylenol aspirin ibuprofen and naproxen but no combinations such as sleep or sinus.

## 2021-06-14 ENCOUNTER — Telehealth: Payer: Self-pay | Admitting: Cardiology

## 2021-06-14 NOTE — Telephone Encounter (Signed)
Spoke to patient just now and let her know Dr. Joya Gaskins recommendations. She verbalizes understanding and thanks me for calling her back.

## 2021-06-14 NOTE — Telephone Encounter (Signed)
Patient c/o Palpitations:  High priority if patient c/o lightheadedness, shortness of breath, or chest pain  How long have you had palpitations/irregular HR/ Afib? Are you having the symptoms now? PVC's not at this time - had 3 episodes today and 1 yesterday  Are you currently experiencing lightheadedness, SOB or CP? Feel discomfort in her chest  Do you have a history of afib (atrial fibrillation) or irregular heart rhythm? History of PVC's  Have you checked your BP or HR? (document readings if available): it was 174/82  Are you experiencing any other symptoms? No- patient wanted an appointment- I made one for Wednesday(06-16-21) with Dr Bettina Gavia- Please call to evaluate

## 2021-06-15 NOTE — Progress Notes (Signed)
Cardiology Office Note:    Date:  06/16/2021   ID:  Erica Small, DOB [birth year], MRN [id number]  PCP:  Ernestene Kiel, MD  Cardiologist:  Shirlee More, MD    Referring MD: Ernestene Kiel, MD    ASSESSMENT:    1. Frequent PVCs   2. Agatston coronary artery calcium score less than 100   3. Benign essential HTN    PLAN:    In order of problems listed above:  She had a flare of arrhythmia without any obvious precipitant due to have labs drawn tomorrow in her PCP office and asked her to request magnesium level and I told her if it occurred in the hospital we would have given her IV magnesium I asked her to continue her current magnesium supplement over-the-counter along with beta-blocker.  I asked her to purchase the wireless receiver with her smart phone so she can screen her heart rhythm at home and if she captures episodes of PVCs can attach and send me through [facility]ora for review Continue high intensity statin she will have labs done tomorrow lipid profile Stable blood pressure target continue current treatment calcium channel blocker   Next appointment: 6 months   Medication Adjustments/Labs and Tests Ordered: Current medicines are reviewed at length with the patient today.  Concerns regarding medicines are outlined above.  Orders Placed This Encounter  Procedures   EKG 12-Lead    No orders of the defined types were placed in this encounter.   Chief Complaint  Patient presents with   Palpitations     History of Present Illness:    Erica Small is a 65 y.o. female with a hx of mitral valve prolapse hypertension type 1 diabetes hyperlipidemia coronary artery calcium score elevated 80th percentile for age and sex and minimal CAD less than 25% calcific plaque in the mid right coronary artery and frequent PVCs last seen 01/05/2021.  She is being seen today with complaint of palpitation.  Compliance with diet, lifestyle and medications: Yes  She had a cluster of  palpitation skipped beats.  Contractions similar to previous PVCs. Prior to seeing me and my request she increase magnesium and has resolved.  No PVCs today. No chest pain shortness of breath or edema Past Medical History:  Diagnosis Date   Benign essential HTN 12/21/2015   Heart murmur    MVP   Hyperlipidemia 12/21/2015   Palpitation 01/02/2017   Type 1 diabetes mellitus without complication ([place]) [id number]    Past Surgical History:  Procedure Laterality Date   BREAST SURGERY     CHOLECYSTECTOMY     COLONOSCOPY     FACIAL COSMETIC SURGERY     face lift    POLYPECTOMY      Current Medications: Current Meds  Medication Sig   Accu-Chek FastClix Lancets MISC Use as directed to check BG 6 times/day.  Dx E10.9   acebutolol (SECTRAL) 200 MG capsule Take 1 capsule (200 mg total) by mouth 2 (two) times daily.   ALPRAZolam (XANAX) 0.25 MG tablet Take 1 tablet by mouth 3 (three) times daily as needed for anxiety.   amLODipine (NORVASC) 5 MG tablet Take 5 mg by mouth daily.   aspirin EC 81 MG tablet Take 81 mg by mouth daily.   Cholecalciferol (VITAMIN D) 125 MCG (5000 UT) CAPS Take 5,000 Units by mouth daily.    Continuous Blood Gluc Transmit (DEXCOM G6 TRANSMITTER) MISC Use as directed for continuous glucose monitoring. Reuse transmitter for 90 days then discard and replace  escitalopram (LEXAPRO) 10 MG tablet Take 10 mg by mouth daily.   estradiol (ESTRACE) 0.5 MG tablet Take 0.5 tablets by mouth daily.   glucose blood (CONTOUR NEXT TEST) test strip Check BG 4 times/day as directed.  Dx E10.9   Insulin Human (INSULIN PUMP) SOLN Inject into the skin. Novlog Insulin given by pump   losartan (COZAAR) 100 MG tablet Take 100 mg by mouth daily.   MILK THISTLE PO Take 1 tablet by mouth daily.   Multiple Vitamin (MULTIVITAMIN) tablet Take 1 tablet by mouth daily.   NOVOLOG 100 UNIT/ML injection Inject into the skin as directed.   OLIVE LEAF PO Take by mouth.   OVER THE COUNTER MEDICATION  Organic Greens BID   OVER THE COUNTER MEDICATION Take 1 tablet by mouth daily. ALL SEASON SUPPORT   progesterone (PROMETRIUM) 200 MG capsule Take 200 mg by mouth daily.    rosuvastatin (CRESTOR) 40 MG tablet Take 40 mg by mouth daily.   SM MAGNESIUM CITRATE PO Take 500 mg by mouth daily.     Allergies:   Patient has no known allergies.   Social History   Socioeconomic History   Marital status: Married    Spouse name: Not on file   Number of children: Not on file   Years of education: Not on file   Highest education level: Not on file  Occupational History   Not on file  Tobacco Use   Smoking status: Never   Smokeless tobacco: Never  Vaping Use   Vaping Use: Never used  Substance and Sexual Activity   Alcohol use: Yes    Comment: very rare    Drug use: No   Sexual activity: Not on file  Other Topics Concern   Not on file  Social History Narrative   Not on file   Social Determinants of Health   Financial Resource Strain: Not on file  Food Insecurity: Not on file  Transportation Needs: Not on file  Physical Activity: Not on file  Stress: Not on file  Social Connections: Not on file     Family History: The patient's family history includes Colon cancer (age of onset: 30) in her sister; Colon cancer (age of onset: 24) in her mother; Colon polyps in her brother, brother, mother, and sister; Heart attack in her brother and father; Hyperlipidemia in her sister; Hypertension in her mother and sister; Stroke in her mother. There is no history of Esophageal cancer, Stomach cancer, or Rectal cancer. ROS:   Please see the history of present illness.    All other systems reviewed and are negative.  EKGs/Labs/Other Studies Reviewed:    The following studies were reviewed today:  EKG:  EKG ordered today and personally reviewed.  The ekg ordered today demonstrates sinus rhythm normal EKG  Recent Labs: 07/22/2020: BUN 18; Creatinine, Ser 0.77; Potassium 4.6; Sodium 141  Recent  Lipid Panel No results found for: CHOL, TRIG, HDL, CHOLHDL, VLDL, LDLCALC, LDLDIRECT  Physical Exam:    VS:  BP 122/82 (BP Location: Right Arm, Patient Position: Sitting, Cuff Size: Normal)   Pulse 61   Ht '5\' 7"'$  (1.702 m)   Wt 149 lb 12.8 oz (67.9 kg)   SpO2 98%   BMI 23.46 kg/m     Wt Readings from Last 3 Encounters:  06/16/21 149 lb 12.8 oz (67.9 kg)  01/05/21 156 lb (70.8 kg)  06/04/20 159 lb 3.2 oz (72.2 kg)     GEN:  Well nourished, well developed in no  acute distress HEENT: Normal NECK: No JVD; No carotid bruits LYMPHATICS: No lymphadenopathy CARDIAC: RRR, no murmurs, rubs, gallops RESPIRATORY:  Clear to auscultation without rales, wheezing or rhonchi  ABDOMEN: Soft, non-tender, non-distended MUSCULOSKELETAL:  No edema; No deformity  SKIN: Warm and dry NEUROLOGIC:  Alert and oriented x 3 PSYCHIATRIC:  Normal affect    Signed, Shirlee More, MD  06/16/2021 9:54 AM    [place]

## 2021-06-16 ENCOUNTER — Other Ambulatory Visit: Payer: Self-pay

## 2021-06-16 ENCOUNTER — Ambulatory Visit (INDEPENDENT_AMBULATORY_CARE_PROVIDER_SITE_OTHER): Payer: Medicare Other | Admitting: Cardiology

## 2021-06-16 ENCOUNTER — Encounter: Payer: Self-pay | Admitting: Cardiology

## 2021-06-16 VITALS — BP 122/82 | HR 61 | Ht 67.0 in | Wt 149.8 lb

## 2021-06-16 DIAGNOSIS — I10 Essential (primary) hypertension: Secondary | ICD-10-CM | POA: Diagnosis not present

## 2021-06-16 DIAGNOSIS — R931 Abnormal findings on diagnostic imaging of heart and coronary circulation: Secondary | ICD-10-CM | POA: Diagnosis not present

## 2021-06-16 DIAGNOSIS — I493 Ventricular premature depolarization: Secondary | ICD-10-CM | POA: Diagnosis not present

## 2021-06-16 NOTE — Patient Instructions (Addendum)
Medication Instructions:  Your physician recommends that you continue on your current medications as directed. Please refer to the Current Medication list given to you today.  *If you need a refill on your cardiac medications before your next appointment, please call your pharmacy*   Lab Work: None If you have labs (blood work) drawn today and your tests are completely normal, you will receive your results only by: Espy (if you have MyChart) OR A paper copy in the mail If you have any lab test that is abnormal or we need to change your treatment, we will call you to review the results.   Testing/Procedures: None   Follow-Up: At [facility], you and your health needs are our priority.  As part of our continuing mission to provide you with exceptional heart care, we have created designated Provider Care Teams.  These Care Teams include your primary Cardiologist (physician) and Advanced Practice Providers (APPs -  Physician Assistants and Nurse Practitioners) who all work together to provide you with the care you need, when you need it.  We recommend signing up for the patient portal called "MyChart".  Sign up information is provided on this After Visit Summary.  MyChart is used to connect with patients for Virtual Visits (Telemedicine).  Patients are able to view lab/test results, encounter notes, upcoming appointments, etc.  Non-urgent messages can be sent to your provider as well.   To learn more about what you can do with MyChart, go to [email or web address].    Your next appointment:   1 year(s)  The format for your next appointment:   In Person  Provider:   Shirlee More, MD   Other Instructions  KardiaMobile [email or web address]        FDA-cleared, clinical grade mobile EKG monitor: Erica Small is the most clinically-validated mobile EKG used by the world's leading cardiac care medical professionals With Basic service, know  instantly if your heart rhythm is normal or if atrial fibrillation is detected, and email the last single EKG recording to yourself or your doctor Premium service, available for purchase through the Kardia app for $9.99 per month or $99 per year, includes unlimited history and storage of your EKG recordings, a monthly EKG summary report to share with your doctor, along with the ability to track your blood pressure, activity and weight Includes one KardiaMobile phone clip FREE SHIPPING: Standard delivery 1-3 business days. Orders placed by 11:00am PST will ship that afternoon. Otherwise, will ship next business day. All orders ship via [employer] from [place], [place]

## 2021-06-30 ENCOUNTER — Telehealth: Payer: Self-pay | Admitting: Cardiology

## 2021-06-30 NOTE — Telephone Encounter (Signed)
Patient would like to send in an EKG with the [facility], but she states she would like to clarify how it will work. Please assist.

## 2021-06-30 NOTE — Telephone Encounter (Signed)
Pt came into the office to get help with KardieMobile app. She was not sure how to send an EKG to Dr. Bettina Gavia. I helped the pt send the EKG, she thanked me for taking the time to help her figure it out.

## 2021-08-05 ENCOUNTER — Other Ambulatory Visit: Payer: Self-pay

## 2021-08-05 ENCOUNTER — Encounter: Payer: Self-pay | Admitting: Cardiology

## 2021-08-05 ENCOUNTER — Ambulatory Visit (INDEPENDENT_AMBULATORY_CARE_PROVIDER_SITE_OTHER): Payer: Medicare Other | Admitting: Cardiology

## 2021-08-05 VITALS — BP 90/70 | HR 60 | Ht 67.0 in | Wt 146.0 lb

## 2021-08-05 DIAGNOSIS — E785 Hyperlipidemia, unspecified: Secondary | ICD-10-CM | POA: Diagnosis not present

## 2021-08-05 DIAGNOSIS — I10 Essential (primary) hypertension: Secondary | ICD-10-CM | POA: Diagnosis not present

## 2021-08-05 DIAGNOSIS — I493 Ventricular premature depolarization: Secondary | ICD-10-CM

## 2021-08-05 DIAGNOSIS — R931 Abnormal findings on diagnostic imaging of heart and coronary circulation: Secondary | ICD-10-CM | POA: Diagnosis not present

## 2021-08-05 NOTE — Patient Instructions (Signed)

## 2021-08-05 NOTE — Progress Notes (Signed)
Cardiology Office Note:    Date:  08/05/2021   ID:  Erica Small, DOB [birth year], MRN [id number]  PCP:  Ernestene Kiel, MD  Cardiologist:  Shirlee More, MD    Referring MD: Ernestene Kiel, MD    ASSESSMENT:    1. Frequent PVCs   2. Agatston coronary artery calcium score less than 100   3. Benign essential HTN   4. Hyperlipidemia, unspecified hyperlipidemia type    PLAN:    In order of problems listed above:  Erica Small continues to do well not having symptomatic PVCs in her case magnesium has been very beneficial along with her low-dose beta-blocker in view of the symptomatic nature of her arrhythmia continue above.  She has the iPhone adapter kardia mobile and can send symptomatic strips to me through MyChart if needed. Fortunately she has no significant CAD we will continue lipid-lowering with a high intensity statin. Stable continue current treatment ARB calcium channel blocker   Next appointment: 1 year   Medication Adjustments/Labs and Tests Ordered: Current medicines are reviewed at length with the patient today.  Concerns regarding medicines are outlined above.  No orders of the defined types were placed in this encounter.  No orders of the defined types were placed in this encounter.   Chief Complaint  Patient presents with   Follow-up    For PVC's    History of Present Illness:    Erica Small is a 65 y.o. female with a hx of mitral valve prolapse hypertension type 1 diabetes hyperlipidemia elevated coronary artery calcium score 80th percentile and very minimal CAD less than 25 calcific plaque percentage in the mid right coronary artery as well as frequent symptomatic PVCs last seen 06/16/2021 and was improved with magnesium supplementation.  She tells me a follow-up magnesium level with her PCP was normal.  She was seen earlier today with endocrinology blood pressure was 140/70.  She continues to do well and is having no further palpitations and tolerates her  beta-blocker.  No edema no chest pain or syncope Compliance with diet, lifestyle and medications: Yes Past Medical History:  Diagnosis Date   Benign essential HTN 12/21/2015   Heart murmur    MVP   Hyperlipidemia 12/21/2015   Palpitation 01/02/2017   Type 1 diabetes mellitus without complication ([place]) 2/70/6237    Past Surgical History:  Procedure Laterality Date   BREAST SURGERY     CHOLECYSTECTOMY     COLONOSCOPY     FACIAL COSMETIC SURGERY     face lift    POLYPECTOMY      Current Medications: Current Meds  Medication Sig   Accu-Chek FastClix Lancets MISC Use as directed to check BG 6 times/day.  Dx E10.9   acebutolol (SECTRAL) 200 MG capsule Take 1 capsule (200 mg total) by mouth 2 (two) times daily.   ALPRAZolam (XANAX) 0.25 MG tablet Take 1 tablet by mouth 3 (three) times daily as needed for anxiety.   amLODipine (NORVASC) 5 MG tablet Take 5 mg by mouth daily.   aspirin EC 81 MG tablet Take 81 mg by mouth daily.   Cholecalciferol (VITAMIN D) 125 MCG (5000 UT) CAPS Take 5,000 Units by mouth daily.    Continuous Blood Gluc Transmit (DEXCOM G6 TRANSMITTER) MISC Use as directed for continuous glucose monitoring. Reuse transmitter for 90 days then discard and replace   escitalopram (LEXAPRO) 10 MG tablet Take 10 mg by mouth daily.   estradiol (ESTRACE) 0.5 MG tablet Take 0.5 tablets by mouth daily.  glucose blood (CONTOUR NEXT TEST) test strip Check BG 4 times/day as directed.  Dx E10.9   Insulin Human (INSULIN PUMP) SOLN Inject into the skin. Novlog Insulin given by pump   losartan (COZAAR) 100 MG tablet Take 100 mg by mouth daily.   metoprolol-hydrochlorothiazide (LOPRESSOR HCT) 50-25 MG tablet Take 1 tablet by mouth 2 (two) times daily.   MILK THISTLE PO Take 1 tablet by mouth daily.   Multiple Vitamin (MULTIVITAMIN) tablet Take 1 tablet by mouth daily.   NOVOLOG 100 UNIT/ML injection Inject into the skin as directed.   OLIVE LEAF PO Take by mouth.   OVER THE COUNTER  MEDICATION Organic Greens BID   OVER THE COUNTER MEDICATION Take 1 tablet by mouth daily. ALL SEASON SUPPORT   progesterone (PROMETRIUM) 200 MG capsule Take 200 mg by mouth daily.    rosuvastatin (CRESTOR) 40 MG tablet Take 40 mg by mouth daily.   SM MAGNESIUM CITRATE PO Take 500 mg by mouth daily.     Allergies:   Patient has no known allergies.   Social History   Socioeconomic History   Marital status: Married    Spouse name: Not on file   Number of children: Not on file   Years of education: Not on file   Highest education level: Not on file  Occupational History   Not on file  Tobacco Use   Smoking status: Never   Smokeless tobacco: Never  Vaping Use   Vaping Use: Never used  Substance and Sexual Activity   Alcohol use: Yes    Comment: very rare    Drug use: No   Sexual activity: Not on file  Other Topics Concern   Not on file  Social History Narrative   Not on file   Social Determinants of Health   Financial Resource Strain: Not on file  Food Insecurity: Not on file  Transportation Needs: Not on file  Physical Activity: Not on file  Stress: Not on file  Social Connections: Not on file     Family History: The patient's family history includes Colon cancer (age of onset: 51) in her sister; Colon cancer (age of onset: 46) in her mother; Colon polyps in her brother, brother, mother, and sister; Heart attack in her brother and father; Hyperlipidemia in her sister; Hypertension in her mother and sister; Stroke in her mother. There is no history of Esophageal cancer, Stomach cancer, or Rectal cancer. ROS:   Please see the history of present illness.    All other systems reviewed and are negative.  EKGs/Labs/Other Studies Reviewed:    The following studies were reviewed today:    Recent Labs: 08/04/2021 A1c 6.6%  Physical Exam:    VS:  BP 90/70 (BP Location: Right Arm, Patient Position: Sitting, Cuff Size: Normal)   Pulse 60   Ht 5\' 7"  (1.702 m)   Wt 146  lb (66.2 kg)   SpO2 96%   BMI 22.87 kg/m     Wt Readings from Last 3 Encounters:  08/05/21 146 lb (66.2 kg)  06/16/21 149 lb 12.8 oz (67.9 kg)  01/05/21 156 lb (70.8 kg)     GEN:  Well nourished, well developed in no acute distress HEENT: Normal NECK: No JVD; No carotid bruits LYMPHATICS: No lymphadenopathy CARDIAC: RRR, no murmurs, rubs, gallops RESPIRATORY:  Clear to auscultation without rales, wheezing or rhonchi  ABDOMEN: Soft, non-tender, non-distended MUSCULOSKELETAL:  No edema; No deformity  SKIN: Warm and dry NEUROLOGIC:  Alert and oriented x 3  PSYCHIATRIC:  Normal affect    Signed, Shirlee More, MD  08/05/2021 1:44 PM    [facility]

## 2021-08-30 ENCOUNTER — Other Ambulatory Visit: Payer: Self-pay | Admitting: Cardiology

## 2021-12-14 IMAGING — CT CT CARDIAC CORONARY ARTERY CALCIUM SCORE
3 series · 14 of 20 positions shown, 15 images · non-contrast
Comparison: None.
COMPARISON: None.

Addendum:
EXAM:
OVER-READ INTERPRETATION  CT CHEST

The following report is an over-read performed by radiologist Dr.
Yaneth Shedd [REDACTED] on 06/29/2020. This over-read
does not include interpretation of cardiac or coronary anatomy or
pathology. The coronary calcium score interpretation by the
cardiologist is attached.
CLINICAL DATA: 64F with hypertension, hyperlipidemia, and mitral
valve prolapse for risk stratification
Coronary Calcium Score
TECHNIQUE: The patient was scanned on a Siemens Force scanner. Axial
non-contrast 3 mm slices were carried out through the heart. The
data set was analyzed on a dedicated work station and scored using
the Agatson method.

[Series 2: casc 3.0 bv41 2 bestdiast 69 % · axial · 0.36mm/px · z∈[-231,-144]mm · 4 of 49 slices shown, 5 images]
[im 10/49  vessel]
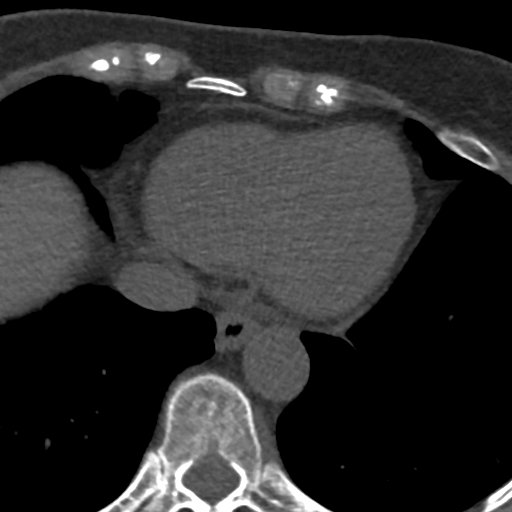
[im 10/49  lung]
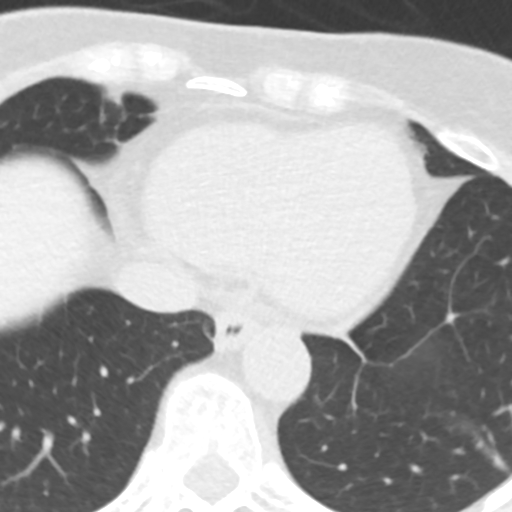
[im 20/49  vessel]
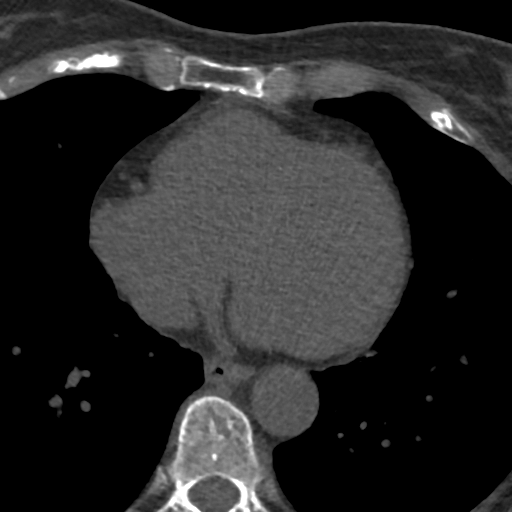
[im 29/49  vessel]
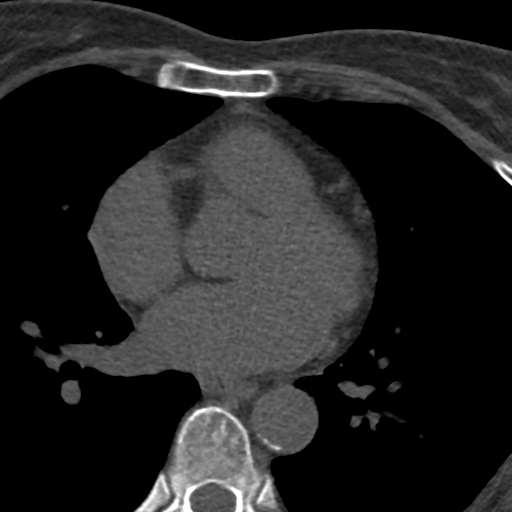
[im 39/49  vessel]
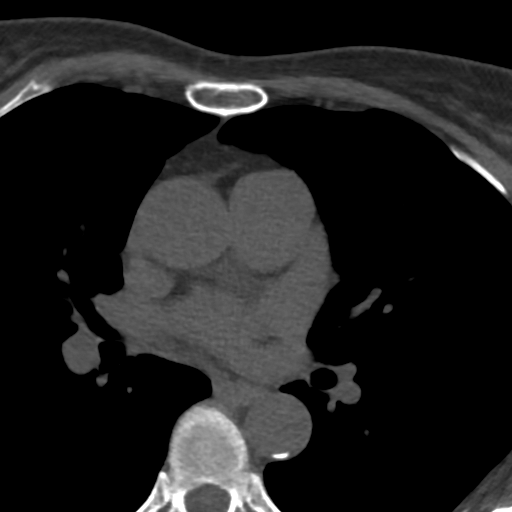

[Series 3: lung 70 % · axial · 0.66mm/px · z∈[-234,-138]mm · 5 of 49 slices shown]
[im 9/49  lung]
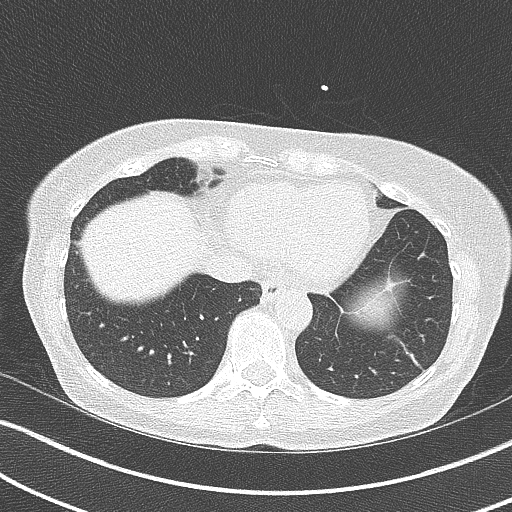
[im 17/49  lung]
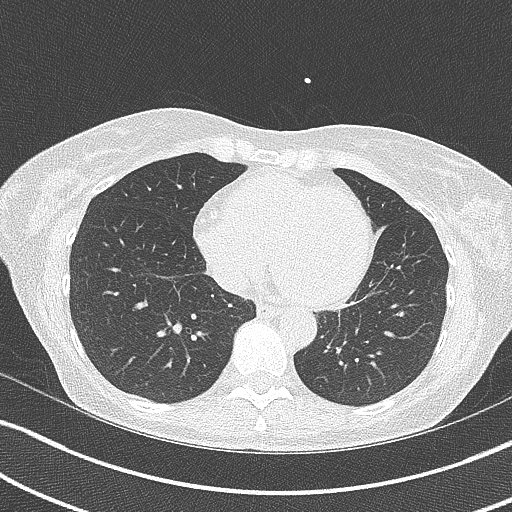
[im 25/49  lung]
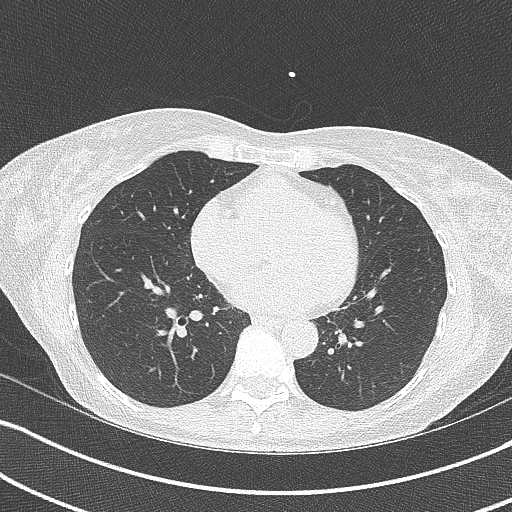
[im 33/49  lung]
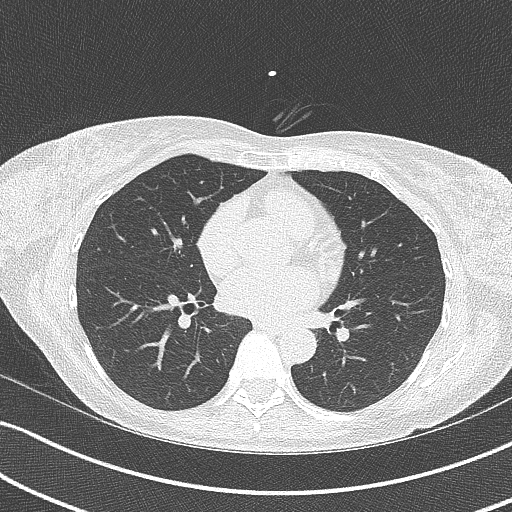
[im 41/49  lung]
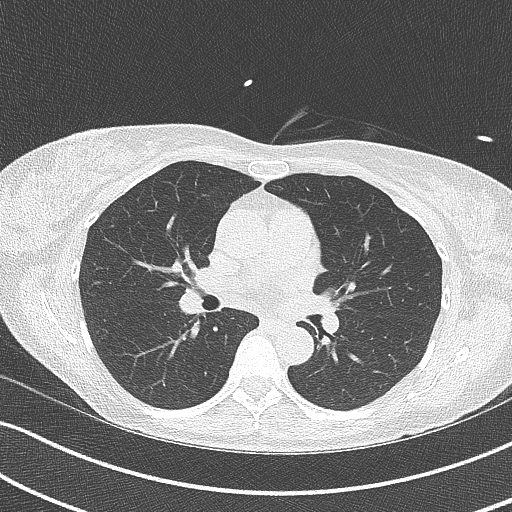

[Series 4: lung st 70 % · axial · 0.66mm/px · z∈[-234,-138]mm · 5 of 49 slices shown]
[im 9/49  lung]
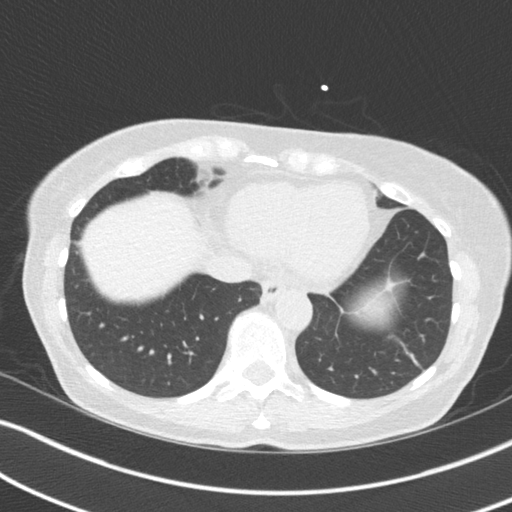
[im 17/49  lung]
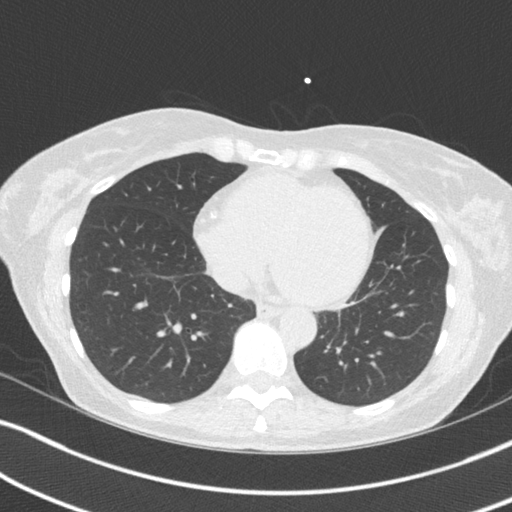
[im 25/49  lung]
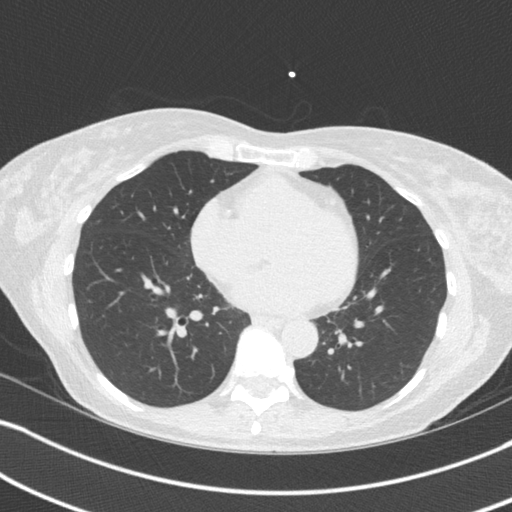
[im 33/49  lung]
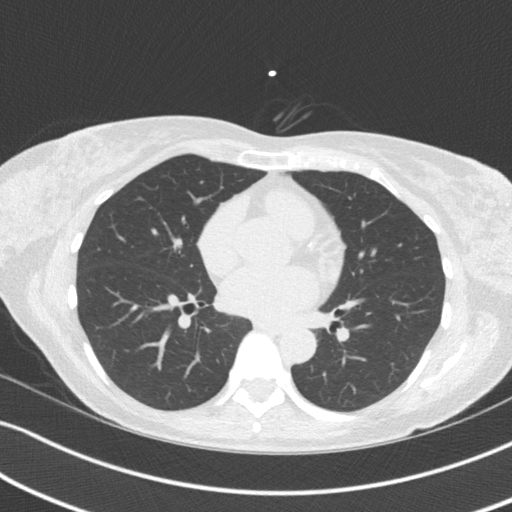
[im 41/49  lung]
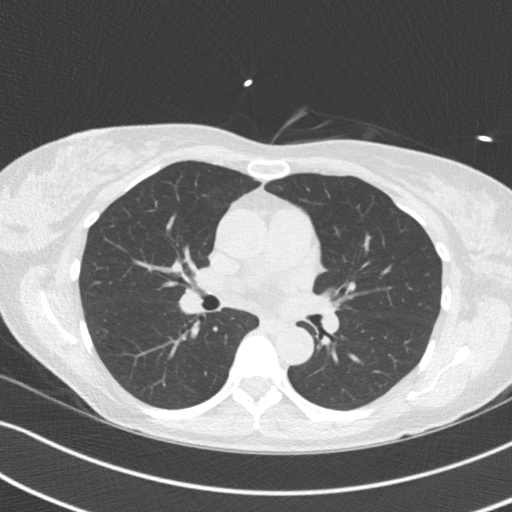

[14 of 20 positions shown; findings below may reference images not displayed]

FINDINGS: Vascular: Scattered calcifications in the descending thoracic aorta.
No aneurysm. Heart is normal size.

Mediastinum/Nodes: No adenopathy.

Lungs/Pleura: Visualized lungs clear.  No effusions.

Upper Abdomen: Imaging into the upper abdomen demonstrates no acute
findings.

Musculoskeletal: Chest wall soft tissues are unremarkable. No acute
bony abnormality.
IMPRESSION: Aortic atherosclerosis.  No acute extra cardiac abnormality.
FINDINGS: Non-cardiac: See separate report from [REDACTED].

Ascending Aorta: Normal size.  3.1 cm.  No calcification.

Pericardium: Normal

Coronary arteries: Normal origins. Right dominant. Calcification in
the LAD and RCA territories.
IMPRESSION: Coronary calcium score of 77.9. This was 80th percentile for age and
sex matched control.

Recommend aggressive risk factor modification including LDL goal
<70.

*** End of Addendum ***
EXAM:
OVER-READ INTERPRETATION  CT CHEST

The following report is an over-read performed by radiologist Dr.
Yaneth Shedd [REDACTED] on 06/29/2020. This over-read
does not include interpretation of cardiac or coronary anatomy or
pathology. The coronary calcium score interpretation by the
cardiologist is attached.
FINDINGS: Vascular: Scattered calcifications in the descending thoracic aorta.
No aneurysm. Heart is normal size.

Mediastinum/Nodes: No adenopathy.

Lungs/Pleura: Visualized lungs clear.  No effusions.

Upper Abdomen: Imaging into the upper abdomen demonstrates no acute
findings.

Musculoskeletal: Chest wall soft tissues are unremarkable. No acute
bony abnormality.
IMPRESSION: Aortic atherosclerosis.  No acute extra cardiac abnormality.

## 2022-01-21 IMAGING — CT CT HEART MORP W/ CTA COR W/ SCORE W/ CA W/CM &/OR W/O CM
4 of 7 series · 8 of 20 positions shown, 9 images · non-contrast
Comparison: None.
COMPARISON: None.

Addendum:
EXAM:
OVER-READ INTERPRETATION  CT CHEST

The following report is an over-read performed by radiologist Dr.
Chamso Moma [REDACTED] on 08/06/2020. This
over-read does not include interpretation of cardiac or coronary
anatomy or pathology. The coronary calcium score/coronary CTA
interpretation by the cardiologist is attached.
CLINICAL DATA: This is a 64 year old female with chest pain.
Cardiac/Coronary  CT
TECHNIQUE: The patient was scanned on a [employer] Force scanner.

[Series 6: best diast 74 % · axial · 0.39mm/px · z∈[+1227,+1278]mm · 2 of 388 slices shown, 3 images]
[im 130/388  vessel]
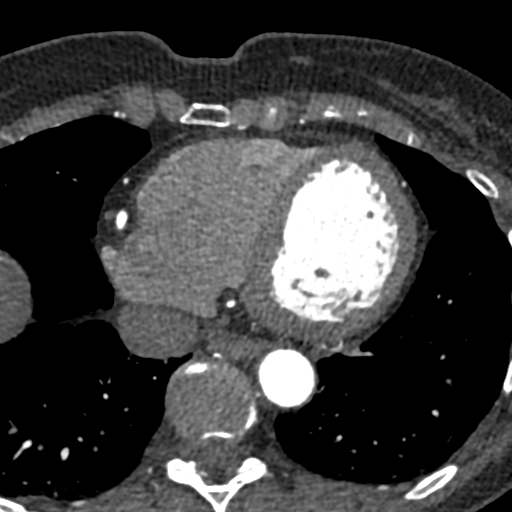
[im 130/388  lung]
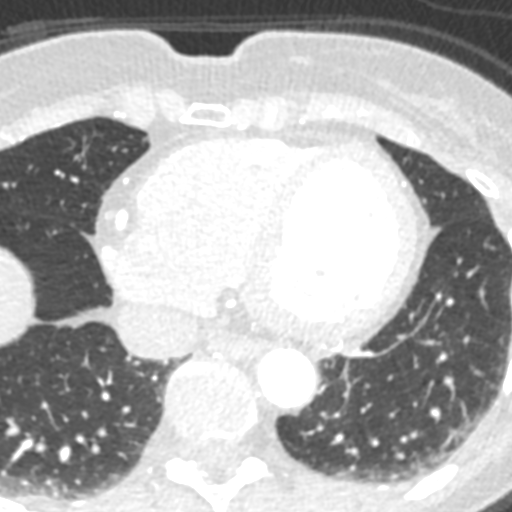
[im 259/388  vessel]
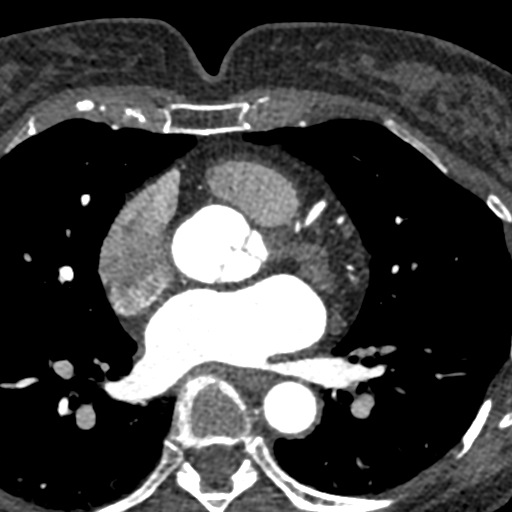

[Series 7: best syst 32 % · axial · 0.39mm/px · z∈[+1227,+1278]mm · 2 of 388 slices shown]
[im 130/388  vessel]
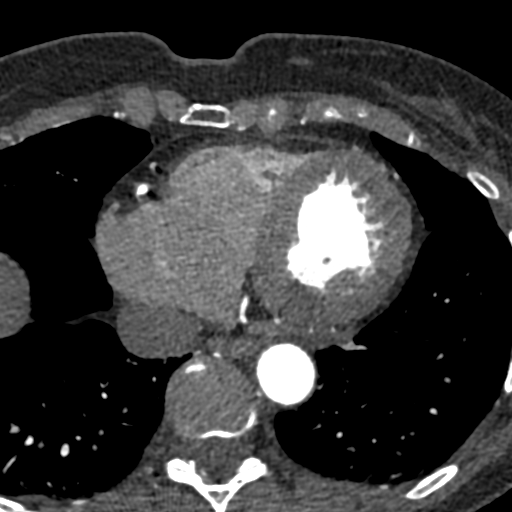
[im 259/388  vessel]
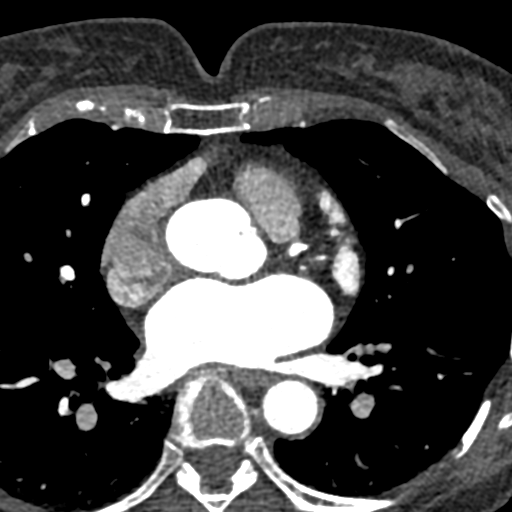

[Series 8: ts diast sharp 74 % · axial · 0.39mm/px · z∈[+1227,+1278]mm · 2 of 388 slices shown]
[im 130/388  lung]
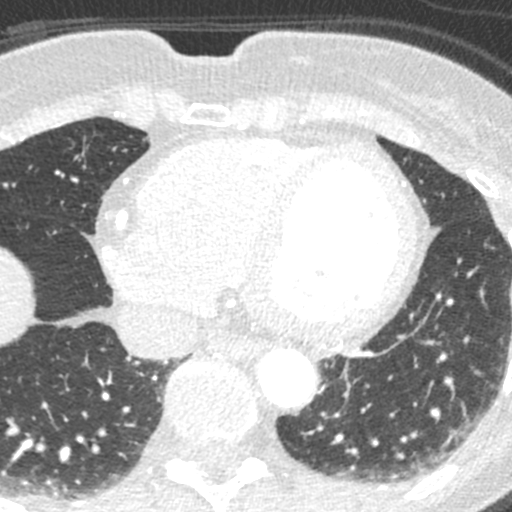
[im 259/388  lung]
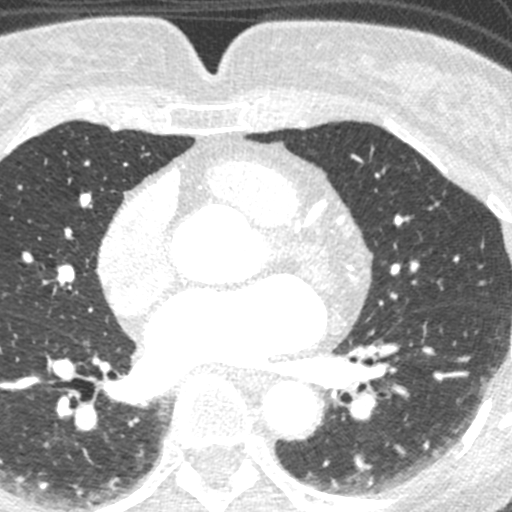

[Series 9: ts syst sharp 32 % · axial · 0.39mm/px · z∈[+1227,+1278]mm · 2 of 388 slices shown]
[im 130/388  lung]
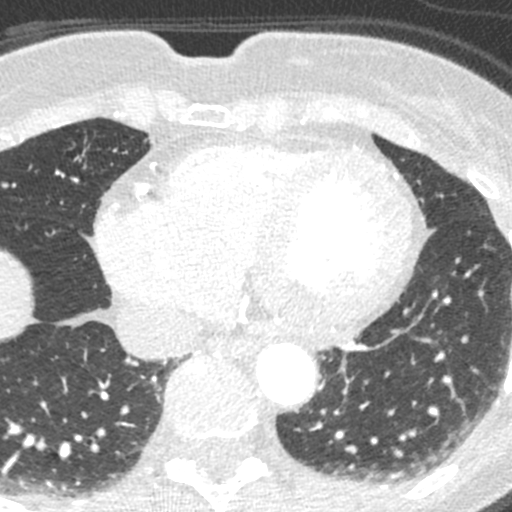
[im 259/388  lung]
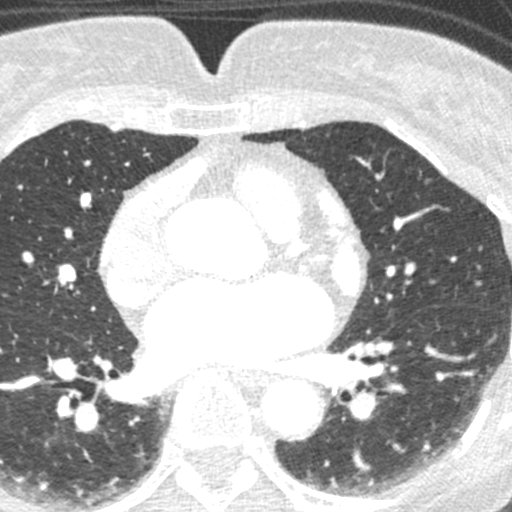

[8 of 20 positions shown; findings below may reference images not displayed]

FINDINGS: Mild scarring or subsegmental atelectasis in the lower lobes of the
lungs bilaterally. Atherosclerotic calcifications in the thoracic
aorta. Within the visualized portions of the thorax there are no
suspicious appearing pulmonary nodules or masses, there is no acute
consolidative airspace disease, no pleural effusions, no
pneumothorax and no lymphadenopathy. Visualized portions of the
upper abdomen are unremarkable. There are no aggressive appearing
lytic or blastic lesions noted in the visualized portions of the
skeleton.
IMPRESSION: 1.  Aortic Atherosclerosis (C7YFM-ST8.8).
FINDINGS: A 120 kV prospective scan was triggered in the descending thoracic
aorta at 111 HU's. Axial non-contrast 3 mm slices were carried out
through the heart. The data set was analyzed on a dedicated work
station and scored using the Agatson method. Gantry rotation speed
was 250 msecs and collimation was .6 mm. No beta blockade and 0.8 mg
of sl NTG was given. The 3D data set was reconstructed in 5%
intervals of the 67-82 % of the R-R cycle. Diastolic phases were
analyzed on a dedicated work station using MPR, MIP and VRT modes.
The patient received 80 cc of contrast.

Aorta: Normal size.  No calcifications.  No dissection.

Aortic Valve:  Trileaflet.  No calcifications.

Coronary Arteries:  Normal coronary origin.  Right dominance.

RCA is a large dominant artery that gives rise to PDA and PLVB.
There is minimal (<24%) calcified plaques in the mid RCA. The
proximal and distal RCA with no plaques.

Left main is a large artery that gives rise to LAD and LCX arteries.

LAD is a large vessel. In the mid LAD there is two small pockets of
minimal calcification in the mid LAD. The proximal and distal LAD
with no plaques.

LCX is a non-dominant artery that gives rise to one large OM1
branch. There is no plaque.

Other findings:

Normal pulmonary vein drainage into the left atrium.

Normal left atrial appendage without a thrombus.

Normal size of the pulmonary artery.
IMPRESSION: 1. Coronary calcium score of 75.4. This was 80 percentile for age
and sex matched control.

2. Normal coronary origin with right dominance.

3. Minimal CAD. CADRADS 1. Recommend medical therapy for primary
prevention.

Dziaba Sapa, DO

*** End of Addendum ***
EXAM:
OVER-READ INTERPRETATION  CT CHEST

The following report is an over-read performed by radiologist Dr.
Chamso Moma [REDACTED] on 08/06/2020. This
over-read does not include interpretation of cardiac or coronary
anatomy or pathology. The coronary calcium score/coronary CTA
interpretation by the cardiologist is attached.
FINDINGS: Mild scarring or subsegmental atelectasis in the lower lobes of the
lungs bilaterally. Atherosclerotic calcifications in the thoracic
aorta. Within the visualized portions of the thorax there are no
suspicious appearing pulmonary nodules or masses, there is no acute
consolidative airspace disease, no pleural effusions, no
pneumothorax and no lymphadenopathy. Visualized portions of the
upper abdomen are unremarkable. There are no aggressive appearing
lytic or blastic lesions noted in the visualized portions of the
skeleton.
IMPRESSION: 1.  Aortic Atherosclerosis (C7YFM-ST8.8).

## 2022-05-13 ENCOUNTER — Telehealth: Payer: Self-pay | Admitting: Cardiology

## 2022-05-13 NOTE — Telephone Encounter (Signed)
Patient c/o Palpitations:  High priority if patient c/o lightheadedness, shortness of breath, or chest pain  How long have you had palpitations/irregular HR/ Afib? Are you having the symptoms now? Has been off and on for the past 3 days, not having symptoms now  Are you currently experiencing lightheadedness, SOB or CP? No  Do you have a history of afib (atrial fibrillation) or irregular heart rhythm? Yes  Have you checked your BP or HR? (document readings if available): HR 86 today   Are you experiencing any other symptoms? Really tired    Wanting an appointment regarding this.

## 2022-05-16 NOTE — Telephone Encounter (Signed)
Called patient and asked her about the symptoms she was experiencing and she reported that it felt like she was having PVC's and that she had been taking the wrong OTC magnesium supplement. She has since switched to another version of the magnesium supplement and has not had anymore symptoms. Patient states she is feeling fine right now and has no symptoms. Her heart rate is 86 and she has no "PVC's" and is not short of breath. I instructed the patient to call us back if the symptoms she was experiencing return. Patient was agreeable with this plan and had no further questions at this time. Patient is on recall for her follow up appointment with Dr. Bettina Gavia in November.

## 2022-08-03 ENCOUNTER — Other Ambulatory Visit: Payer: Self-pay | Admitting: Cardiology

## 2022-08-25 ENCOUNTER — Other Ambulatory Visit: Payer: Self-pay | Admitting: Cardiology

## 2022-10-12 ENCOUNTER — Telehealth: Payer: Self-pay | Admitting: Cardiology

## 2022-10-12 ENCOUNTER — Other Ambulatory Visit: Payer: Self-pay

## 2022-10-12 MED ORDER — ACEBUTOLOL HCL 200 MG PO CAPS: 200.0000 mg | ORAL_CAPSULE | Freq: Two times a day (BID) | ORAL | 0 refills | Status: DC

## 2022-10-12 NOTE — Telephone Encounter (Signed)
*  STAT* If patient is at the pharmacy, call can be transferred to refill team.   1. Which medications need to be refilled? (please list name of each medication and dose if known)   acebutolol (SECTRAL) 200 MG capsule   2. Which pharmacy/location (including street and city if local pharmacy) is medication to be sent to?  [facility], [place] to Registered [facility]   3. Do they need a 30 day or 90 day supply?  90 day  Patient stated she still has some of this medication.

## 2022-10-12 NOTE — Telephone Encounter (Signed)
Rx refill sent to pharmacy. 

## 2022-10-19 ENCOUNTER — Encounter: Payer: Self-pay | Admitting: Cardiology

## 2022-10-19 ENCOUNTER — Ambulatory Visit: Payer: Medicare Other | Attending: Cardiology | Admitting: Cardiology

## 2022-10-19 VITALS — BP 120/60 | HR 53 | Ht 67.0 in | Wt 149.0 lb

## 2022-10-19 DIAGNOSIS — I341 Nonrheumatic mitral (valve) prolapse: Secondary | ICD-10-CM | POA: Insufficient documentation

## 2022-10-19 DIAGNOSIS — I10 Essential (primary) hypertension: Secondary | ICD-10-CM

## 2022-10-19 DIAGNOSIS — E785 Hyperlipidemia, unspecified: Secondary | ICD-10-CM | POA: Insufficient documentation

## 2022-10-19 DIAGNOSIS — E109 Type 1 diabetes mellitus without complications: Secondary | ICD-10-CM | POA: Diagnosis present

## 2022-10-19 DIAGNOSIS — I2510 Atherosclerotic heart disease of native coronary artery without angina pectoris: Secondary | ICD-10-CM | POA: Diagnosis not present

## 2022-10-19 DIAGNOSIS — R931 Abnormal findings on diagnostic imaging of heart and coronary circulation: Secondary | ICD-10-CM

## 2022-10-19 NOTE — Patient Instructions (Signed)
Medication Instructions:  Your physician recommends that you continue on your current medications as directed. Please refer to the Current Medication list given to you today.  *If you need a refill on your cardiac medications before your next appointment, please call your pharmacy*   Lab Work: None If you have labs (blood work) drawn today and your tests are completely normal, you will receive your results only by: MyChart Message (if you have MyChart) OR A paper copy in the mail If you have any lab test that is abnormal or we need to change your treatment, we will call you to review the results.   Testing/Procedures: None   Follow-Up: At [facility], you and your health needs are our priority.  As part of our continuing mission to provide you with exceptional heart care, we have created designated Provider Care Teams.  These Care Teams include your primary Cardiologist (physician) and Advanced Practice Providers (APPs -  Physician Assistants and Nurse Practitioners) who all work together to provide you with the care you need, when you need it.  We recommend signing up for the patient portal called "MyChart".  Sign up information is provided on this After Visit Summary.  MyChart is used to connect with patients for Virtual Visits (Telemedicine).  Patients are able to view lab/test results, encounter notes, upcoming appointments, etc.  Non-urgent messages can be sent to your provider as well.   To learn more about what you can do with MyChart, go to [email or web address].    Your next appointment:   1 year(s)  Provider:   Brian Munley, MD    Other Instructions None  

## 2022-10-19 NOTE — Progress Notes (Signed)
Cardiology Office Note:    Date:  10/19/2022   ID:  Erica Small, DOB 1956/03/01, MRN [id number]  PCP:  Ernestene Kiel, MD  Cardiologist:  Shirlee More, MD    Referring MD: Ernestene Kiel, MD please do an LP(a) with next labs in your office   ASSESSMENT:    1. Mitral valve prolapse   2. Mild CAD   3. Agatston coronary artery calcium score less than 100   4. Benign essential HTN   5. Hyperlipidemia, unspecified hyperlipidemia type   6. Type 1 diabetes mellitus without complication (HCC)    PLAN:    In order of problems listed above:  Cardiology perspective Justus has done well with her mild CAD mitral valve prolapse asymptomatic continue current medical treatment including low-dose beta-blocker and aspirin calcium channel blocker and she has resumed her statin.  If LDL remains above target would benefit from combined therapy either PCSK9 inhibitor or inclisiran. BP at target continue treatment including ARB with her diabetes She has resumed her statin she has arrangements 1 month recheck lipid profile at her PCP office on last review and LP(a) Stable diabetes well-controlled with continuous glucose monitor insulin and insulin pump   Next appointment: 1 year   Medication Adjustments/Labs and Tests Ordered: Current medicines are reviewed at length with the patient today.  Concerns regarding medicines are outlined above.  No orders of the defined types were placed in this encounter.  No orders of the defined types were placed in this encounter.    History of Present Illness:    Erica Small is a 67 y.o. female with a hx of mitral valve prolapse hypertension type 1 diabetes hyperlipidemia elevated coronary artery calcium score 75/80th percentile and very minimal CAD less than 25 calcific plaque percentage in the mid right coronary artery as well as frequent symptomatic PVCs last seen 08/05/2021.  Laboratory studies from PCP 06/16/2022: Cholesterol 303 triglyceride 91  HDL 91 LDL cholesterol severely elevated 197 non-HDL cholesterol 106.  There Compliance with diet, lifestyle and medications: Yes  Is always nice to see you.  Is a high degree of medical knowledge and is quite interested in her care She is concerned about the detriment of taking a statin withdrew from rosuvastatin LDL was 197 has resumed has arrangements for follow-up lipid profile with primary care physician Would like to see her and LP(a) done with her next lipid profile and goal LDL in her case should be less than 70 If not at target I think a PCSK9 inhibitor or inclisiran be a good adjunct Cardiovascular perspective and not having edema shortness of breath chest pain palpitation or syncope. Past Medical History:  Diagnosis Date   Benign essential HTN 12/21/2015   Heart murmur    MVP   Hyperlipidemia 12/21/2015   Palpitation 01/02/2017   Type 1 diabetes mellitus without complication ([place]) 6/59/9357    Past Surgical History:  Procedure Laterality Date   BREAST SURGERY     CHOLECYSTECTOMY     COLONOSCOPY     FACIAL COSMETIC SURGERY     face lift    POLYPECTOMY      Current Medications: Current Meds  Medication Sig   Accu-Chek FastClix Lancets MISC Use as directed to check BG 6 times/day.  Dx E10.9   acebutolol (SECTRAL) 200 MG capsule Take 1 capsule (200 mg total) by mouth 2 (two) times daily. Patient needs appointment for further refills. 1 st attempt   ALPRAZolam (XANAX) 0.25 MG tablet Take 1 tablet by mouth 3 (  three) times daily as needed for anxiety.   amLODipine (NORVASC) 5 MG tablet Take 5 mg by mouth daily.   aspirin EC 81 MG tablet Take 81 mg by mouth daily.   Cholecalciferol (VITAMIN D) 125 MCG (5000 UT) CAPS Take 5,000 Units by mouth daily.    Continuous Blood Gluc Transmit (DEXCOM G6 TRANSMITTER) MISC Use as directed for continuous glucose monitoring. Reuse transmitter for 90 days then discard and replace   escitalopram (LEXAPRO) 10 MG tablet Take 10 mg by mouth daily.    estradiol (ESTRACE) 0.5 MG tablet Take 0.5 tablets by mouth daily.   glucose blood (CONTOUR NEXT TEST) test strip Check BG 4 times/day as directed.  Dx E10.9   Insulin Human (INSULIN PUMP) SOLN Inject into the skin. Novlog Insulin given by pump   losartan (COZAAR) 100 MG tablet Take 100 mg by mouth daily.   MILK THISTLE PO Take 1 tablet by mouth daily.   Multiple Vitamin (MULTIVITAMIN) tablet Take 1 tablet by mouth daily.   NOVOLOG 100 UNIT/ML injection Inject into the skin as directed.   OLIVE LEAF PO Take by mouth.   OVER THE COUNTER MEDICATION Organic Greens BID   OVER THE COUNTER MEDICATION Take 1 tablet by mouth daily. ALL SEASON SUPPORT   progesterone (PROMETRIUM) 200 MG capsule Take 200 mg by mouth daily.    rosuvastatin (CRESTOR) 40 MG tablet Take 40 mg by mouth daily.   SM MAGNESIUM CITRATE PO Take 500 mg by mouth daily.     Allergies:   Patient has no known allergies.   Social History   Socioeconomic History   Marital status: Married    Spouse name: Not on file   Number of children: Not on file   Years of education: Not on file   Highest education level: Not on file  Occupational History   Not on file  Tobacco Use   Smoking status: Never   Smokeless tobacco: Never  Vaping Use   Vaping Use: Never used  Substance and Sexual Activity   Alcohol use: Yes    Comment: very rare    Drug use: No   Sexual activity: Not on file  Other Topics Concern   Not on file  Social History Narrative   Not on file   Social Determinants of Health   Financial Resource Strain: Not on file  Food Insecurity: Not on file  Transportation Needs: Not on file  Physical Activity: Not on file  Stress: Not on file  Social Connections: Not on file     Family History: The patient's family history includes Colon cancer (age of onset: 67) in her sister; Colon cancer (age of onset: 39) in her mother; Colon polyps in her brother, brother, mother, and sister; Heart attack in her brother and  father; Hyperlipidemia in her sister; Hypertension in her mother and sister; Stroke in her mother. There is no history of Esophageal cancer, Stomach cancer, or Rectal cancer. ROS:   Please see the history of present illness.    All other systems reviewed and are negative.  EKGs/Labs/Other Studies Reviewed:    The following studies were reviewed today:  EKG:  EKG ordered today and personally reviewed.  The ekg ordered today demonstrates Sinus rhythm 53 bpm normal EKG   Physical Exam:    VS:  BP 120/60 (BP Location: Right Arm, Patient Position: Sitting)   Pulse (!) 53   Ht '5\' 7"'$  (1.702 m)   Wt 149 lb (67.6 kg)   SpO2 97%  BMI 23.34 kg/m     Wt Readings from Last 3 Encounters:  10/19/22 149 lb (67.6 kg)  08/05/21 146 lb (66.2 kg)  06/16/21 149 lb 12.8 oz (67.9 kg)     GEN: F2 Well nourished, well developed in no acute distress HEENT: Normal NECK: No JVD; No carotid bruits LYMPHATICS: No lymphadenopathy CARDIAC: RRR, no murmurs, rubs, gallops RESPIRATORY:  Clear to auscultation without rales, wheezing or rhonchi  ABDOMEN: Soft, non-tender, non-distended MUSCULOSKELETAL:  No edema; No deformity  SKIN: Warm and dry NEUROLOGIC:  Alert and oriented x 3 PSYCHIATRIC:  Normal affect    Signed, Shirlee More, MD  10/19/2022 1:12 PM    [facility]

## 2022-10-31 ENCOUNTER — Telehealth: Payer: Self-pay | Admitting: Cardiology

## 2022-10-31 ENCOUNTER — Encounter: Payer: Self-pay | Admitting: Cardiology

## 2022-10-31 NOTE — Telephone Encounter (Signed)
Patient calling to requesting to send en email of an EKG from her [facility] app. Patient also sent a mychart message.

## 2022-10-31 NOTE — Telephone Encounter (Signed)
Called patient and she reported that she was having PVC's again. This patient also sent a message to MyChart and I forwarded that message to Dr. Bettina Gavia for his recommendation.

## 2022-11-03 DIAGNOSIS — Z794 Long term (current) use of insulin: Secondary | ICD-10-CM | POA: Insufficient documentation

## 2022-11-03 NOTE — Telephone Encounter (Signed)
Pt is returning call to get update and is requesting return call.

## 2022-11-08 NOTE — Telephone Encounter (Signed)
Patient is calling back upset she has not heard back regarding her PVC issues. She is now requesting a provider switch to Dr. Agustin Cree do to feeling she gets no response from Dr. Bettina Gavia. Advised patient I am happy to send another message to have the nurse return her call today, bu she stated she would rather just go through with a switch at this point. Please advise.

## 2022-11-11 NOTE — Telephone Encounter (Signed)
Called and left message for pt that Dr. Bettina Gavia had levt a message for pt to increase beta blocker to 3 x daily and make an appt with Dr. Agustin Cree as soon as he has an opening. Sent to front desk to make an appt.

## 2022-11-14 ENCOUNTER — Telehealth: Payer: Self-pay | Admitting: Cardiology

## 2022-11-14 MED ORDER — METOPROLOL TARTRATE 25 MG PO TABS: 25.0000 mg | ORAL_TABLET | Freq: Three times a day (TID) | ORAL | 3 refills | Status: DC

## 2022-11-14 NOTE — Telephone Encounter (Signed)
*  STAT* If patient is at the pharmacy, call can be transferred to refill team.   1. Which medications need to be refilled? (please list name of each medication and dose if known)  Lopressor 25 MG  2. Which pharmacy/location (including street and city if local pharmacy) is medication to be sent to? [facility] [place] [place] [facility]  3. Do they need a 30 day or 90 day supply?  90 day supply

## 2022-12-21 ENCOUNTER — Other Ambulatory Visit: Payer: Self-pay | Admitting: Cardiology

## 2023-01-31 ENCOUNTER — Other Ambulatory Visit: Payer: Self-pay | Admitting: Cardiology

## 2023-02-07 ENCOUNTER — Telehealth: Payer: Self-pay | Admitting: Cardiology

## 2023-02-07 NOTE — Telephone Encounter (Signed)
Pt c/o medication issue:  1. Name of Medication: metoprolol tartrate (LOPRESSOR) 25 MG tablet acebutolol (SECTRAL) 200 MG capsule  2. How are you currently taking this medication (dosage and times per day)? As written  3. Are you having a reaction (difficulty breathing--STAT)? No  4. What is your medication issue? Mikayla from [facility] is calling to verify if the patient is taking both of these medication or if one medication is replacing the other medication. Mikayla left a ref # [id number] Please advise.

## 2023-02-09 ENCOUNTER — Telehealth: Payer: Self-pay | Admitting: Cardiology

## 2023-02-09 ENCOUNTER — Other Ambulatory Visit: Payer: Self-pay

## 2023-02-09 MED ORDER — METOPROLOL TARTRATE 25 MG PO TABS: 25.0000 mg | ORAL_TABLET | Freq: Three times a day (TID) | ORAL | 3 refills | Status: DC

## 2023-02-09 NOTE — Telephone Encounter (Signed)
*  STAT* If patient is at the pharmacy, call can be transferred to refill team.   1. Which medications need to be refilled? (please list name of each medication and dose if known) Metoprolol  2. Which pharmacy/location (including street and city if local pharmacy) is medication to be sent to? [facility]  3. Do they need a 30 day or 90 day supply? 90 days and refills

## 2023-02-09 NOTE — Telephone Encounter (Signed)
Metoprolol medication refilled for the patient and the patient was informed. Patient had no further questions at this time.

## 2023-05-09 DIAGNOSIS — Z9641 Presence of insulin pump (external) (internal): Secondary | ICD-10-CM | POA: Insufficient documentation

## 2023-07-06 ENCOUNTER — Other Ambulatory Visit: Payer: Self-pay | Admitting: Cardiology

## 2023-08-14 ENCOUNTER — Encounter: Payer: Self-pay | Admitting: Gastroenterology

## 2023-08-28 ENCOUNTER — Encounter: Payer: Self-pay | Admitting: Gastroenterology

## 2023-09-15 ENCOUNTER — Telehealth: Payer: Self-pay

## 2023-09-15 ENCOUNTER — Other Ambulatory Visit: Payer: Self-pay

## 2023-09-15 MED ORDER — ACEBUTOLOL HCL 200 MG PO CAPS: 200.0000 mg | ORAL_CAPSULE | Freq: Two times a day (BID) | ORAL | 0 refills | Status: DC

## 2023-09-15 NOTE — Telephone Encounter (Signed)
Medication sent.

## 2023-09-20 ENCOUNTER — Telehealth: Payer: Self-pay

## 2023-09-20 NOTE — Telephone Encounter (Signed)
Sent to [facility]

## 2023-09-25 ENCOUNTER — Other Ambulatory Visit: Payer: Self-pay

## 2023-10-06 MED ORDER — LOSARTAN POTASSIUM 100 MG PO TABS: 100.0000 mg | ORAL_TABLET | Freq: Every day | ORAL | 0 refills | Status: DC

## 2023-10-06 NOTE — Telephone Encounter (Signed)
 Spoke with pt who states that she does take Losartan 100 mg daily. RX has been sent.

## 2023-10-06 NOTE — Addendum Note (Signed)
 Addended by: Eleonore Chiquito on: 10/06/2023 02:49 PM   Modules accepted: Orders

## 2023-11-01 ENCOUNTER — Encounter: Payer: Self-pay | Admitting: Cardiology

## 2023-11-01 DIAGNOSIS — I493 Ventricular premature depolarization: Secondary | ICD-10-CM | POA: Insufficient documentation

## 2023-11-01 DIAGNOSIS — R42 Dizziness and giddiness: Secondary | ICD-10-CM | POA: Insufficient documentation

## 2023-11-01 DIAGNOSIS — Z8679 Personal history of other diseases of the circulatory system: Secondary | ICD-10-CM | POA: Insufficient documentation

## 2023-11-02 ENCOUNTER — Ambulatory Visit: Payer: Medicare Other | Attending: Cardiology | Admitting: Cardiology

## 2023-11-02 ENCOUNTER — Encounter: Payer: Self-pay | Admitting: Cardiology

## 2023-11-02 VITALS — BP 104/78 | HR 67 | Ht 67.0 in | Wt 155.0 lb

## 2023-11-02 DIAGNOSIS — R0609 Other forms of dyspnea: Secondary | ICD-10-CM | POA: Diagnosis present

## 2023-11-02 DIAGNOSIS — E109 Type 1 diabetes mellitus without complications: Secondary | ICD-10-CM

## 2023-11-02 DIAGNOSIS — I341 Nonrheumatic mitral (valve) prolapse: Secondary | ICD-10-CM | POA: Diagnosis not present

## 2023-11-02 DIAGNOSIS — I10 Essential (primary) hypertension: Secondary | ICD-10-CM | POA: Diagnosis not present

## 2023-11-02 DIAGNOSIS — I493 Ventricular premature depolarization: Secondary | ICD-10-CM | POA: Diagnosis present

## 2023-11-02 DIAGNOSIS — E782 Mixed hyperlipidemia: Secondary | ICD-10-CM

## 2023-11-02 NOTE — Patient Instructions (Addendum)
Medication Instructions:   STOP: Amlodipine   Lab Work: Your physician recommends that you return for lab work in: when fasting You need to have labs done when you are fasting.  You can come Monday through Friday 8:30 am to 12:00 pm and 1:15 to 4:30. You do not need to make an appointment as the order has already been placed. The labs you are going to have done are AST, ALT Lipids.    Testing/Procedures: Your physician has requested that you have an echocardiogram. Echocardiography is a painless test that uses sound waves to create images of your heart. It provides your doctor with information about the size and shape of your heart and how well your heart's chambers and valves are working. This procedure takes approximately one hour. There are no restrictions for this procedure. Please do NOT wear cologne, perfume, aftershave, or lotions (deodorant is allowed). Please arrive 15 minutes prior to your appointment time.  Please note: We ask at that you not bring children with you during ultrasound (echo/ vascular) testing. Due to room size and safety concerns, children are not allowed in the ultrasound rooms during exams. Our front office staff cannot provide observation of children in our lobby area while testing is being conducted. An adult accompanying a patient to their appointment will only be allowed in the ultrasound room at the discretion of the ultrasound technician under special circumstances. We apologize for any inconvenience.    Follow-Up: At [facility], you and your health needs are our priority.  As part of our continuing mission to provide you with exceptional heart care, we have created designated Provider Care Teams.  These Care Teams include your primary Cardiologist (physician) and Advanced Practice Providers (APPs -  Physician Assistants and Nurse Practitioners) who all work together to provide you with the care you need, when you need it.  We recommend signing up for the  patient portal called "MyChart".  Sign up information is provided on this After Visit Summary.  MyChart is used to connect with patients for Virtual Visits (Telemedicine).  Patients are able to view lab/test results, encounter notes, upcoming appointments, etc.  Non-urgent messages can be sent to your provider as well.   To learn more about what you can do with MyChart, go to [email or web address].    Your next appointment:   12 month(s)  The format for your next appointment:   In Person  Provider:   Gypsy Balsam, MD    Other Instructions NA

## 2023-11-02 NOTE — Progress Notes (Signed)
Cardiology Office Note:    Date:  11/02/2023   ID:  [name], DOB 24-Dec-1955, MRN [id number]  PCP:  Philemon Kingdom, MD  Cardiologist:  Gypsy Balsam, MD    Referring MD: Philemon Kingdom, MD   Chief Complaint  Patient presents with   Annual Exam    History of Present Illness:    Erica Small is a 68 y.o. female past medical history significant for diabetes, mitral valve prolapse, hyperlipidemia, palpitation from of PVCs, essential hypertension.  Comes today to months for follow-up overall she is doing well.  She denies have any chest pain tightness squeezing pressure burning chest.  She is very active she goes to gym on the regular basis 3 times a week.  Does yoga weightlifting also some stationary bike.  No difficulties denies have any palpitations no dizziness no passing out  Past Medical History:  Diagnosis Date   Benign essential HTN 12/21/2015   Heart murmur    MVP   Hyperlipidemia 12/21/2015   Mitral valve prolapse 12/06/2017   Palpitation 01/02/2017   PVC (premature ventricular contraction) 11/30/2020   Type 1 diabetes mellitus without complication (HCC) 12/21/2015    Past Surgical History:  Procedure Laterality Date   BREAST SURGERY     CHOLECYSTECTOMY     COLONOSCOPY     FACIAL COSMETIC SURGERY     face lift    POLYPECTOMY      Current Medications: Current Meds  Medication Sig   Accu-Chek FastClix Lancets MISC 1 each by Other route daily.   acebutolol (SECTRAL) 200 MG capsule Take 1 capsule (200 mg total) by mouth 2 (two) times daily.   ALPRAZolam (XANAX) 0.25 MG tablet Take 1 tablet by mouth 3 (three) times daily as needed for anxiety.   amLODipine (NORVASC) 5 MG tablet Take 5 mg by mouth daily.   aspirin EC 81 MG tablet Take 81 mg by mouth daily.   Cholecalciferol (VITAMIN D) 125 MCG (5000 UT) CAPS Take 5,000 Units by mouth daily.    Continuous Blood Gluc Transmit (DEXCOM G6 TRANSMITTER) MISC 1 each by Other route daily. Use as directed for  continuous glucose monitoring. Reuse transmitter for 90 days then discard and replace   escitalopram (LEXAPRO) 10 MG tablet Take 10 mg by mouth daily.   estradiol (ESTRACE) 0.5 MG tablet Take 0.5 tablets by mouth daily.   glucose blood (CONTOUR NEXT TEST) test strip 1 each by Other route as needed for other (Glucose check).   Insulin Human (INSULIN PUMP) SOLN Inject into the skin. Novlog Insulin given by pump   losartan (COZAAR) 100 MG tablet Take 1 tablet (100 mg total) by mouth daily.   metoprolol tartrate (LOPRESSOR) 25 MG tablet Take 25 mg by mouth 2 (two) times daily.   MILK THISTLE PO Take 1 tablet by mouth daily.   Multiple Vitamin (MULTIVITAMIN) tablet Take 1 tablet by mouth daily.   NOVOLOG 100 UNIT/ML injection Inject 1 mL into the skin as directed.   OLIVE LEAF PO Take 1 tablet by mouth daily.   OVER THE COUNTER MEDICATION Take 1 tablet by mouth daily. Organic Greens BID   OVER THE COUNTER MEDICATION Take 1 tablet by mouth daily. ALL SEASON SUPPORT   progesterone (PROMETRIUM) 200 MG capsule Take 200 mg by mouth daily.    rosuvastatin (CRESTOR) 40 MG tablet Take 40 mg by mouth daily.   rosuvastatin (CRESTOR) 40 MG tablet Take 40 mg by mouth once a week.   SM MAGNESIUM CITRATE PO Take 500  mg by mouth daily.   [DISCONTINUED] progesterone (PROMETRIUM) 200 MG capsule Take 200 mg by mouth at bedtime.     Allergies:   Patient has no known allergies.   Social History   Socioeconomic History   Marital status: Married    Spouse name: Not on file   Number of children: Not on file   Years of education: Not on file   Highest education level: Not on file  Occupational History   Not on file  Tobacco Use   Smoking status: Never   Smokeless tobacco: Never  Vaping Use   Vaping status: Never Used  Substance and Sexual Activity   Alcohol use: Yes    Comment: very rare    Drug use: No   Sexual activity: Not on file  Other Topics Concern   Not on file  Social History Narrative   Not  on file   Social Drivers of Health   Financial Resource Strain: Not on file  Food Insecurity: Low Risk  (05/09/2023)   Received from [facility]   Hunger Vital Sign    Worried About Running Out of Food in the Last Year: Never true    Ran Out of Food in the Last Year: Never true  Transportation Needs: Not on file (05/09/2023)  Recent Concern: Transportation Needs - Unmet Transportation Needs (05/09/2023)   Received from [facility]    In the past 12 months, has lack of reliable transportation kept you from medical appointments, meetings, work or from getting things needed for daily living? : Yes  Physical Activity: Not on file  Stress: Not on file  Social Connections: Not on file     Family History: The patient's family history includes Colon cancer (age of onset: 28) in her sister; Colon cancer (age of onset: 39) in her mother; Colon polyps in her brother, brother, mother, and sister; Heart attack in her brother and father; Hyperlipidemia in her sister; Hypertension in her mother and sister; Stroke in her mother. There is no history of Esophageal cancer, Stomach cancer, or Rectal cancer. ROS:   Please see the history of present illness.    All 14 point review of systems negative except as described per history of present illness  EKGs/Labs/Other Studies Reviewed:         Recent Labs: No results found for requested labs within last 365 days.  Recent Lipid Panel No results found for: "CHOL", "TRIG", "HDL", "CHOLHDL", "VLDL", "LDLCALC", "LDLDIRECT"  Physical Exam:    VS:  BP 104/78 (BP Location: Left Arm, Patient Position: Sitting, Cuff Size: Normal)   Pulse 67   Ht 5\' 7"  (1.702 m)   Wt 155 lb (70.3 kg)   SpO2 96%   BMI 24.28 kg/m     Wt Readings from Last 3 Encounters:  11/02/23 155 lb (70.3 kg)  10/19/22 149 lb (67.6 kg)  08/05/21 146 lb (66.2 kg)     GEN:  Well nourished, well developed in no acute distress HEENT: Normal NECK: No JVD; No carotid  bruits LYMPHATICS: No lymphadenopathy CARDIAC: RRR, no murmurs, no rubs, no gallops RESPIRATORY:  Clear to auscultation without rales, wheezing or rhonchi  ABDOMEN: Soft, non-tender, non-distended MUSCULOSKELETAL:  No edema; No deformity  SKIN: Warm and dry LOWER EXTREMITIES: no swelling NEUROLOGIC:  Alert and oriented x 3 PSYCHIATRIC:  Normal affect   ASSESSMENT:    1. Benign essential HTN   2. PVC (premature ventricular contraction)   3. Mitral valve prolapse  4. Type 1 diabetes mellitus without complication (HCC)   5. Mixed hyperlipidemia    PLAN:    In order of problems listed above:  Benign essential hypertension actually blood pressure is low and she reports that her blood pressure is low, I will discontinue her amlodipine. PVCs she is on acebutolol as well as metoprolol quarter strength combination but seems to be working for her so we will continue. History of mitral valve prolapse will repeat echocardiogram to check the lesion. Type 1 diabetes that being followed by endocrinologist and her medicine team. Mixed dyslipidemia she have intolerance to statin give her brain fog.  She takes Crestor only once a week as well as diet however her baseline LDL is very high at 97, therefore, I doubt those measures will be sufficient.  On top of that she does have elevation of LP(a).  I think she can clearly benefit from PCSK9 agent.  She is being already on diet and Crestor once a week for 4 months, will check fasting lipid profile if it is elevated then we will initiate either Praluent or Repatha   Medication Adjustments/Labs and Tests Ordered: Current medicines are reviewed at length with the patient today.  Concerns regarding medicines are outlined above.  Orders Placed This Encounter  Procedures   EKG 12-Lead   Medication changes: No orders of the defined types were placed in this encounter.   Signed, Georgeanna Lea, MD, [facility] 11/02/2023 8:47 AM    [facility]

## 2023-11-03 ENCOUNTER — Telehealth: Payer: Self-pay

## 2023-11-03 NOTE — Telephone Encounter (Signed)
Faxed clearance to [facility] Edythe Lynn

## 2023-11-03 NOTE — Telephone Encounter (Signed)
Hello, This patient has a pre-visit on 2/3 and a colonoscopy scheduled with Dr. Chales Abrahams on 11/28/23.  Her records indicate she is on an insulin pump.  Would you please reach out to her physician for pump settings. Thank you, Erica Small

## 2023-11-06 ENCOUNTER — Ambulatory Visit (AMBULATORY_SURGERY_CENTER): Payer: Medicare Other

## 2023-11-06 VITALS — Ht 67.0 in | Wt 155.0 lb

## 2023-11-06 DIAGNOSIS — Z800 Family history of malignant neoplasm of digestive organs: Secondary | ICD-10-CM

## 2023-11-06 DIAGNOSIS — Z860100 Personal history of colon polyps, unspecified: Secondary | ICD-10-CM

## 2023-11-06 MED ORDER — SUTAB 1479-225-188 MG PO TABS: ORAL_TABLET | ORAL | 0 refills | Status: DC

## 2023-11-06 NOTE — Progress Notes (Signed)
No egg or soy allergy known to patient  No issues known to pt with past sedation with any surgeries or procedures Patient denies ever being told they had issues or difficulty with intubation  No FH of Malignant Hyperthermia Pt is not on diet pills Pt is not on  home 02  Pt is not on blood thinners  Pt denies issues with constipation  No A fib or A flutter Have any cardiac testing pending--11/21/23.  Pt instructed to call us if anything is abnormal with Echo, she verbalized understanding.  Pt can ambulate  Pt denies use of chewing tobacco Discussed diabetic I weight loss medication holds Discussed NSAID holds Checked BMI Pt instructed to use Singlecare.com or GoodRx for a price reduction on prep  Patient's chart reviewed by Cathlyn Parsons CNRA prior to previsit and patient appropriate for the [facility].  Pre visit completed and red dot placed by patient's name on their procedure day (on provider's schedule).

## 2023-11-07 LAB — AST: AST: 24 [IU]/L (ref 0–40)

## 2023-11-07 LAB — LIPID PANEL
Chol/HDL Ratio: 3.4 {ratio} (ref 0.0–4.4)
Cholesterol, Total: 295 mg/dL — ABNORMAL HIGH (ref 100–199)
HDL: 88 mg/dL (ref >39)
LDL Chol Calc (NIH): 188 mg/dL — ABNORMAL HIGH (ref 0–99)
Triglycerides: 111 mg/dL (ref 0–149)
VLDL Cholesterol Cal: 19 mg/dL (ref 5–40)

## 2023-11-07 LAB — ALT: ALT: 18 [IU]/L (ref 0–32)

## 2023-11-07 NOTE — Telephone Encounter (Signed)
 Clearnace given  Per Lionel Molt PA-C exact words: To Whom it May Concerns: Ms. Erica Small (DOB 2056-09-22) is currently on a Tandem insulin  pump to manage her type 1 diabetes mellitus. Ms Schorr has a pump that is running in Control IQ at this time. On the day of prep this feature should adjust itself automatically for any variance in diet, or lower blood sugars. At midnight, the night prior to her procedure, she will need turn off Control IQ and run a temporary basal rate with a 50% reduction through the time of the procedure. Once the procedure is completed, and she is able to eat normally again, she can go back into the Control IQ mode on her insulin  pump at her normal rates. If you have any further questions please let me know. Thank you Autumn S Jones PA-C. MHS, RD  Patient sent to scanning.

## 2023-11-07 NOTE — Telephone Encounter (Signed)
Spoke with Fleet Contras and she sent a message to Autumn PA and her medical assistant regarding this clearance and said someone should be reaching out to [place]

## 2023-11-08 NOTE — Telephone Encounter (Signed)
 Called to go over instructions, but no answer.  No voice mail available.

## 2023-11-09 ENCOUNTER — Telehealth: Payer: Self-pay | Admitting: Emergency Medicine

## 2023-11-09 DIAGNOSIS — E782 Mixed hyperlipidemia: Secondary | ICD-10-CM

## 2023-11-09 NOTE — Telephone Encounter (Signed)
-----   Message from Erica Small sent at 11/09/2023 10:52 AM EST ----- Clearly cholesterol is very poorly controlled.  Her LDL is 188!Aaron Aas  Please refer her to our pharmacist to initiate PCSK9 agent

## 2023-11-09 NOTE — Telephone Encounter (Signed)
 Called patient to go over insulin  pump instructions, VM obtained and message left to call back.

## 2023-11-09 NOTE — Addendum Note (Signed)
 Addended by: Rydge Texidor P on: 11/09/2023 02:11 PM   Modules accepted: Orders

## 2023-11-09 NOTE — Telephone Encounter (Addendum)
 Called and spoke to patient and reviewed Dr. Gordan Latina result note and recommendations. Patient verbalized understanding and had no further questions. Lipid clinic referral placed.

## 2023-11-10 NOTE — Telephone Encounter (Signed)
 Patient contacted via telephone.  Insulin  pump instructions reviewed and she verbalized understanding.  She also states she had a doctor's appointment yesterday and the staff went over the insulin  pump instructions with her as well.

## 2023-11-21 ENCOUNTER — Ambulatory Visit: Payer: Medicare Other | Attending: Cardiology

## 2023-11-21 DIAGNOSIS — R0609 Other forms of dyspnea: Secondary | ICD-10-CM | POA: Diagnosis present

## 2023-11-21 LAB — ECHOCARDIOGRAM COMPLETE
Area-P 1/2: 3.45 cm2
MV M vel: 4.57 m/s
MV Peak grad: 83.5 mm[Hg]
Radius: 0.5 cm
S' Lateral: 3.1 cm

## 2023-11-23 ENCOUNTER — Telehealth: Payer: Self-pay

## 2023-11-23 NOTE — Telephone Encounter (Signed)
 LVM and My Chart Message per DPR- per Dr. Vanetta Shawl note regarding Echo results. Encouraged to call with any questions or concerns

## 2023-11-24 ENCOUNTER — Telehealth: Payer: Self-pay

## 2023-11-24 NOTE — Telephone Encounter (Signed)
 Spoke with patient, aware of my chart message and had no further questions.

## 2023-11-24 NOTE — Telephone Encounter (Signed)
-----   Message from Gypsy Balsam sent at 11/23/2023  9:59 AM EST ----- Echocardiogram showed preserved left ventricular ejection fraction overall mild to moderate mitral valve regurgitation all of this just conservative approach no intervention needed

## 2023-11-26 ENCOUNTER — Encounter: Payer: Self-pay | Admitting: Certified Registered Nurse Anesthetist

## 2023-11-28 ENCOUNTER — Encounter: Payer: Self-pay | Admitting: Gastroenterology

## 2023-11-28 ENCOUNTER — Ambulatory Visit (AMBULATORY_SURGERY_CENTER): Payer: Medicare Other | Admitting: Gastroenterology

## 2023-11-28 VITALS — BP 133/83 | HR 73 | Temp 98.1°F | Resp 15 | Ht 67.0 in | Wt 155.0 lb

## 2023-11-28 DIAGNOSIS — D123 Benign neoplasm of transverse colon: Secondary | ICD-10-CM

## 2023-11-28 DIAGNOSIS — K5730 Diverticulosis of large intestine without perforation or abscess without bleeding: Secondary | ICD-10-CM

## 2023-11-28 DIAGNOSIS — K640 First degree hemorrhoids: Secondary | ICD-10-CM | POA: Diagnosis not present

## 2023-11-28 DIAGNOSIS — Z1211 Encounter for screening for malignant neoplasm of colon: Secondary | ICD-10-CM

## 2023-11-28 DIAGNOSIS — Z860100 Personal history of colon polyps, unspecified: Secondary | ICD-10-CM

## 2023-11-28 DIAGNOSIS — Z800 Family history of malignant neoplasm of digestive organs: Secondary | ICD-10-CM

## 2023-11-28 MED ORDER — SODIUM CHLORIDE 0.9 % IV SOLN: 500.0000 mL | Freq: Once | INTRAVENOUS | Status: DC

## 2023-11-28 NOTE — Progress Notes (Signed)
 [facility] History and Physical   Primary Care Physician:  Philemon Kingdom, MD   Reason for Procedure:   FH CRC/H/O polyps  Plan:    coon     HPI: Erica Small is a 68 y.o. female    Past Medical History:  Diagnosis Date   Benign essential HTN 12/21/2015   Heart murmur    MVP   Hyperlipidemia 12/21/2015   Mitral valve prolapse 12/06/2017   Palpitation 01/02/2017   PVC (premature ventricular contraction) 11/30/2020   Type 1 diabetes mellitus without complication (HCC) 12/21/2015    Past Surgical History:  Procedure Laterality Date   BREAST SURGERY     CHOLECYSTECTOMY     COLONOSCOPY     FACIAL COSMETIC SURGERY     face lift    POLYPECTOMY      Prior to Admission medications   Medication Sig Start Date End Date Taking? Authorizing Provider  Accu-Chek FastClix Lancets MISC 1 each by Other route daily. 07/28/17  Yes [provider]  acebutolol (SECTRAL) 200 MG capsule Take 1 capsule (200 mg total) by mouth 2 (two) times daily. 09/15/23  Yes Baldo Daub, MD  aspirin EC 81 MG tablet Take 81 mg by mouth daily.   Yes [provider]  Cholecalciferol (VITAMIN D) 125 MCG (5000 UT) CAPS Take 5,000 Units by mouth daily.    Yes [provider]  Continuous Blood Gluc Transmit (DEXCOM G6 TRANSMITTER) MISC 1 each by Other route daily. Use as directed for continuous glucose monitoring. Reuse transmitter for 90 days then discard and replace 04/20/21  Yes [provider]  escitalopram (LEXAPRO) 10 MG tablet Take 10 mg by mouth daily. 01/02/17  Yes [provider]  estradiol (ESTRACE) 0.5 MG tablet Take 0.5 tablets by mouth daily. 05/20/19  Yes [provider]  Insulin Human (INSULIN PUMP) SOLN Inject into the skin. Novlog Insulin given by pump   Yes [provider]  losartan (COZAAR) 100 MG tablet Take 1 tablet (100 mg total) by mouth daily. 10/06/23  Yes Georgeanna Lea, MD  metoprolol tartrate (LOPRESSOR)  25 MG tablet Take 25 mg by mouth 2 (two) times daily.   Yes [provider]  MILK THISTLE PO Take 1 tablet by mouth daily.   Yes [provider]  Multiple Vitamin (MULTIVITAMIN) tablet Take 1 tablet by mouth daily.   Yes [provider]  OLIVE LEAF PO Take 1 tablet by mouth daily.   Yes [provider]  OVER THE COUNTER MEDICATION Take 1 tablet by mouth daily. Organic Greens BID   Yes [provider]  OVER THE COUNTER MEDICATION Take 1 tablet by mouth daily. ALL SEASON SUPPORT   Yes [provider]  progesterone (PROMETRIUM) 200 MG capsule Take 200 mg by mouth daily.  05/20/19  Yes [provider]  SM MAGNESIUM CITRATE PO Take 500 mg by mouth daily.   Yes [provider]  ALPRAZolam (XANAX) 0.25 MG tablet Take 1 tablet by mouth 3 (three) times daily as needed for anxiety. 04/15/19   [provider]  glucose blood (CONTOUR NEXT TEST) test strip 1 each by Other route as needed for other (Glucose check). 11/22/18   [provider]  LANTUS 100 UNIT/ML injection Inject 40 units subcutaneous once a day, if unable to wear insulin pump 11/14/23   [provider]  NOVOLOG 100 UNIT/ML injection Inject 1 mL into the skin as directed. 05/25/20   [provider]  predniSONE (DELTASONE) 20  MG tablet Take 40 mg by mouth daily. 11/20/23   [provider]    Current Outpatient Medications  Medication Sig Dispense Refill   Accu-Chek FastClix Lancets MISC 1 each by Other route daily.     acebutolol (SECTRAL) 200 MG capsule Take 1 capsule (200 mg total) by mouth 2 (two) times daily. 180 capsule 0   aspirin EC 81 MG tablet Take 81 mg by mouth daily.     Cholecalciferol (VITAMIN D) 125 MCG (5000 UT) CAPS Take 5,000 Units by mouth daily.      Continuous Blood Gluc Transmit (DEXCOM G6 TRANSMITTER) MISC 1 each by Other route daily. Use as directed for continuous glucose monitoring. Reuse transmitter for 90 days  then discard and replace     escitalopram (LEXAPRO) 10 MG tablet Take 10 mg by mouth daily.     estradiol (ESTRACE) 0.5 MG tablet Take 0.5 tablets by mouth daily.     Insulin Human (INSULIN PUMP) SOLN Inject into the skin. Novlog Insulin given by pump     losartan (COZAAR) 100 MG tablet Take 1 tablet (100 mg total) by mouth daily. 90 tablet 0   metoprolol tartrate (LOPRESSOR) 25 MG tablet Take 25 mg by mouth 2 (two) times daily.     MILK THISTLE PO Take 1 tablet by mouth daily.     Multiple Vitamin (MULTIVITAMIN) tablet Take 1 tablet by mouth daily.     OLIVE LEAF PO Take 1 tablet by mouth daily.     OVER THE COUNTER MEDICATION Take 1 tablet by mouth daily. Organic Greens BID     OVER THE COUNTER MEDICATION Take 1 tablet by mouth daily. ALL SEASON SUPPORT     progesterone (PROMETRIUM) 200 MG capsule Take 200 mg by mouth daily.      SM MAGNESIUM CITRATE PO Take 500 mg by mouth daily.     ALPRAZolam (XANAX) 0.25 MG tablet Take 1 tablet by mouth 3 (three) times daily as needed for anxiety.     glucose blood (CONTOUR NEXT TEST) test strip 1 each by Other route as needed for other (Glucose check).     LANTUS 100 UNIT/ML injection Inject 40 units subcutaneous once a day, if unable to wear insulin pump     NOVOLOG 100 UNIT/ML injection Inject 1 mL into the skin as directed.     predniSONE (DELTASONE) 20 MG tablet Take 40 mg by mouth daily.     No current facility-administered medications for this visit.    Allergies as of 11/28/2023   (No Known Allergies)    Family History  Problem Relation Age of Onset   Stroke Mother    Colon cancer Mother 42   Hypertension Mother    Colon polyps Mother    Heart attack Father    Hyperlipidemia Sister    Hypertension Sister    Colon cancer Sister 63   Colon polyps Sister    Heart attack Brother    Colon polyps Brother    Colon polyps Brother    Esophageal cancer Neg Hx    Rectal cancer Neg Hx    Stomach cancer Neg Hx     Social History    Socioeconomic History   Marital status: Married    Spouse name: Not on file   Number of children: Not on file   Years of education: Not on file   Highest education level: Not on file  Occupational History   Not on file  Tobacco Use   Smoking status: Never  Smokeless tobacco: Never  Vaping Use   Vaping status: Never Used  Substance and Sexual Activity   Alcohol use: Yes    Comment: very rare    Drug use: No   Sexual activity: Yes    Birth control/protection: Post-menopausal  Other Topics Concern   Not on file  Social History Narrative   Not on file   Social Drivers of Health   Financial Resource Strain: Not on file  Food Insecurity: Low Risk  (05/09/2023)   Received from [facility]   Hunger Vital Sign    Worried About Running Out of Food in the Last Year: Never true    Ran Out of Food in the Last Year: Never true  Transportation Needs: Not on file (05/09/2023)  Recent Concern: Transportation Needs - Unmet Transportation Needs (05/09/2023)   Received from [facility]    In the past 12 months, has lack of reliable transportation kept you from medical appointments, meetings, work or from getting things needed for daily living? : Yes  Physical Activity: Not on file  Stress: Not on file  Social Connections: Not on file  Intimate Partner Violence: Not on file    Review of Systems: Positive for no e All other review of systems negative except as mentioned in the HPI.  Physical Exam: Vital signs in last 24 hours: @VSRANGES @   General:   Alert,  Well-developed, well-nourished, pleasant and cooperative in NAD Lungs:  Clear throughout to auscultation.   Heart:  Regular rate and rhythm; no murmurs, clicks, rubs,  or gallops. Abdomen:  Soft, nontender and nondistended. Normal bowel sounds.   Neuro/Psych:  Alert and cooperative. Normal mood and affect. A and O x 3    No significant changes were identified.  The patient continues to be an appropriate  candidate for the planned procedure and anesthesia.   Edman Circle, MD. [facility] 11/28/2023 3:04 PM@

## 2023-11-28 NOTE — Patient Instructions (Signed)

## 2023-11-28 NOTE — Progress Notes (Signed)
 Report given to PACU, vss

## 2023-11-28 NOTE — Op Note (Signed)
 [facility] Patient Name: Erica Small Procedure Date: 11/28/2023 9:16 AM MRN: [id number] Endoscopist: Lynann Bologna , MD, [id number] Age: 68 Referring MD:  Date of Birth: 1956/01/11 Gender: Female Account #: [id number] Procedure:                Colonoscopy Indications:              High risk colon cancer surveillance: 1. Personal                            history of colonic polyps. 2. FH colon cancer (                            mother at age 45, sister at age 46) Medicines:                Monitored Anesthesia Care Procedure:                Pre-Anesthesia Assessment:                           - Prior to the procedure, a History and Physical                            was performed, and patient medications and                            allergies were reviewed. The patient's tolerance of                            previous anesthesia was also reviewed. The risks                            and benefits of the procedure and the sedation                            options and risks were discussed with the patient.                            All questions were answered, and informed consent                            was obtained. Prior Anticoagulants: The patient has                            taken no anticoagulant or antiplatelet agents. ASA                            Grade Assessment: II - A patient with mild systemic                            disease. After reviewing the risks and benefits,                            the patient was deemed in satisfactory condition to  undergo the procedure.                           After obtaining informed consent, the colonoscope                            was passed under direct vision. Throughout the                            procedure, the patient's blood pressure, pulse, and                            oxygen saturations were monitored continuously. The                            Olympus PCF Scope SN:  [id number] was introduced                            through the anus and advanced to the the cecum,                            identified by appendiceal orifice and ileocecal                            valve. The colonoscopy was performed without                            difficulty. The patient tolerated the procedure                            well. The quality of the bowel preparation was                            adequate to identify polyps. The terminal ileum,                            ileocecal valve, appendiceal orifice, and rectum                            were photographed. Scope In: 9:28:10 AM Scope Out: 9:42:18 AM Scope Withdrawal Time: 0 hours 9 minutes 34 seconds  Total Procedure Duration: 0 hours 14 minutes 8 seconds  Findings:                 A 6 mm polyp was found in the distal transverse                            colon. The polyp was sessile. The polyp was removed                            with a cold snare. Resection and retrieval were                            complete.  A few small-mouthed diverticula were found in the                            sigmoid colon.                           Non-bleeding internal hemorrhoids were found during                            retroflexion. The hemorrhoids were small and Grade                            I (internal hemorrhoids that do not prolapse).                           The terminal ileum appeared normal.                           The exam was otherwise without abnormality on                            direct and retroflexion views. Complications:            No immediate complications. Estimated Blood Loss:     Estimated blood loss: none. Impression:               - One 6 mm polyp in the distal transverse colon,                            removed with a cold snare. Resected and retrieved.                           - Mild sigmoid diverticulosis.                           - Non-bleeding internal  hemorrhoids.                           - The examined portion of the ileum was normal.                           - The examination was otherwise normal on direct                            and retroflexion views. Recommendation:           - Patient has a contact number available for                            emergencies. The signs and symptoms of potential                            delayed complications were discussed with the                            patient. Return to normal activities tomorrow.  Written discharge instructions were provided to the                            patient.                           - Resume previous diet.                           - Continue present medications.                           - Await pathology results.                           - Repeat colonoscopy for surveillance based on                            pathology results. Likely in 5 years given strong                            family history with a 2-day prep.                           - The findings and recommendations were discussed                            with the patient's family. Lynann Bologna, MD 11/28/2023 9:49:06 AM This report has been signed electronically.

## 2023-11-28 NOTE — Progress Notes (Signed)
 Called to room to assist during endoscopic procedure.  Patient ID and intended procedure confirmed with present staff. Received instructions for my participation in the procedure from the performing physician.

## 2023-11-28 NOTE — Progress Notes (Signed)
 Pt states rate is 50 % now on her insulin pump, however, since CBG is 70, pt states in the past she has taken basal rate off during procedure and feels comfortable turning basal rate off at this point.  States she will turn pump off at this point and feels that will be the best right now.

## 2023-11-29 ENCOUNTER — Telehealth: Payer: Self-pay | Admitting: *Deleted

## 2023-11-29 NOTE — Telephone Encounter (Signed)
  Follow up Call-     11/28/2023    9:06 AM  Call back number  Post procedure Call Back phone  # [phone]  Permission to leave phone message Yes   [facility]

## 2023-11-30 LAB — SURGICAL PATHOLOGY

## 2023-12-06 ENCOUNTER — Encounter: Payer: Self-pay | Admitting: Gastroenterology

## 2023-12-19 ENCOUNTER — Other Ambulatory Visit: Payer: Self-pay | Admitting: Cardiology

## 2023-12-19 NOTE — Telephone Encounter (Signed)
 Rx refill sent to pharmacy.

## 2024-01-11 ENCOUNTER — Ambulatory Visit
Payer: PRIVATE HEALTH INSURANCE | Attending: Cardiovascular Disease | Admitting: Pharmacist Clinician (PhC)/ Clinical Pharmacy Specialist

## 2024-01-11 ENCOUNTER — Encounter: Payer: Self-pay | Admitting: Pharmacist Clinician (PhC)/ Clinical Pharmacy Specialist

## 2024-01-11 ENCOUNTER — Telehealth: Payer: Self-pay | Admitting: Pharmacist Clinician (PhC)/ Clinical Pharmacy Specialist

## 2024-01-11 DIAGNOSIS — E782 Mixed hyperlipidemia: Secondary | ICD-10-CM | POA: Insufficient documentation

## 2024-01-11 NOTE — Assessment & Plan Note (Signed)
 Assessment: Patient with ASCVD and familial hyperlipidemia not at LDL goal of < 55 Most recent LDL 188 on 11/06/23 Not able to tolerate statins secondary to brain fog Reviewed options for lowering LDL cholesterol, including ezetimibe, PCSK-9 inhibitors, bempedoic acid and inclisiran.  Discussed mechanisms of action, dosing, side effects, potential decreases in LDL cholesterol and costs.  Also reviewed potential options for patient assistance.  Plan: Patient agreeable to starting Repatha 140 mg q14d Repeat labs after:  3 months Lipid Liver function Patient has already met $2000 OOP on prescriptions for 2025.

## 2024-01-11 NOTE — Patient Instructions (Signed)
 Your Results:             Your most recent labs Goal  Total Cholesterol 295 < 200  Triglycerides 111 < 150  HDL (happy/good cholesterol) 88 > 40  LDL (lousy/bad cholesterol 188 < 70   Medication changes:  We will start the process to get Repatha covered by your insurance.  Once the prior authorization is complete, I will send a MyChart message to let you know and confirm pharmacy information.   You will take one injection every 14 days  Lab orders:  We want to repeat labs after 2-3 months.  We will send you a lab order to remind you once we get closer to that time.     Thank you for choosing [facility]

## 2024-01-11 NOTE — Telephone Encounter (Signed)
 Please do PA for Repatha.  Could probably be either familial of ASCVD

## 2024-01-11 NOTE — Progress Notes (Signed)
 Office Visit    Patient Name: Erica Small Date of Encounter: 01/11/2024  Primary Care Provider:  Philemon Kingdom, MD Primary Cardiologist:  None  Chief Complaint    Hyperlipidemia   Significant Past Medical History   HTN BP low at last visit, amlodipine d/c'd  CAD CAC = 77.9 (80th percentile)  PVC's Controlled with acebutolol and metoprolol  DM1 Diagnosed at age 68 (DM 1.5)        No Known Allergies  History of Present Illness    Erica Small is a 68 y.o. female patient of Dr Bing Matter, in the office today to discuss options for cholesterol management.  Insurance Carrier: [employer] [place] [id number] deductible, then 15%   Pharmacy:   [facility]  [facility]well:   no - has already met her OOP pharmacy total  LDL Cholesterol goal:  LDL < 55  Current Medications:   none  Previously tried:  atorvastatin, rosuvastatin - brain fog  Family Hx: father died at 42 from MI, mother had high cholesterol, died at 61.  Younger brother died at 38 from MI, other siblings with high cholesterol.  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), twice weekly does weighted cardio  Accessory Clinical Findings   Lab Results  Component Value Date   CHOL 295 (H) 11/06/2023   HDL 88 11/06/2023   LDLCALC 188 (H) 11/06/2023   TRIG 111 11/06/2023   CHOLHDL 3.4 11/06/2023    No results found for: "LIPOA"  Lab Results  Component Value Date   ALT 18 11/06/2023   AST 24 11/06/2023   Lab Results  Component Value Date   CREATININE 0.77 07/22/2020   BUN 18 07/22/2020   NA 141 07/22/2020   K 4.6 07/22/2020   CL 105 07/22/2020    CO2 21 07/22/2020   No results found for: "HGBA1C"  Home Medications    Current Outpatient Medications  Medication Sig Dispense Refill   Accu-Chek FastClix Lancets MISC 1 each by Other route daily.     acebutolol (SECTRAL) 200 MG capsule Take 1 capsule (200 mg total) by mouth 2 (two) times daily. 180 capsule 0   ALPRAZolam (XANAX) 0.25 MG tablet Take 1 tablet by mouth 3 (three) times daily as needed for anxiety.     aspirin EC 81 MG tablet Take 81 mg by mouth daily.     Cholecalciferol (VITAMIN D) 125 MCG (5000 UT) CAPS Take 5,000 Units by mouth daily.      Continuous Blood Gluc Transmit (DEXCOM G6 TRANSMITTER) MISC 1 each by Other route daily. Use as directed for continuous glucose monitoring. Reuse transmitter for 90 days then discard and replace     escitalopram (LEXAPRO) 10 MG tablet Take 10 mg by mouth daily.     estradiol (ESTRACE) 0.5 MG tablet Take 0.5 tablets by mouth daily.     glucose blood (CONTOUR NEXT TEST) test strip 1 each by Other route as needed for other (Glucose check).     Insulin Human (INSULIN PUMP) SOLN Inject into the skin. Novlog Insulin given by pump     LANTUS 100 UNIT/ML injection Inject 40 units subcutaneous  once a day, if unable to wear insulin pump     losartan (COZAAR) 100 MG tablet TAKE 1 TABLET EVERY DAY 90 tablet 2   metoprolol tartrate (LOPRESSOR) 25 MG tablet Take 25 mg by mouth 2 (two) times daily.     MILK THISTLE PO Take 1 tablet by mouth daily.     Multiple Vitamin (MULTIVITAMIN) tablet Take 1 tablet by mouth daily.     NOVOLOG 100 UNIT/ML injection Inject 1 mL into the skin as directed.     OLIVE LEAF PO Take 1 tablet by mouth daily.     OVER THE COUNTER MEDICATION Take 1 tablet by mouth daily. Organic Greens BID     OVER THE COUNTER MEDICATION Take 1 tablet by mouth daily. ALL SEASON SUPPORT     predniSONE (DELTASONE) 20 MG tablet Take 40 mg by mouth daily.     progesterone (PROMETRIUM) 200 MG capsule Take 200 mg by mouth daily.      SM  MAGNESIUM CITRATE PO Take 500 mg by mouth daily.     No current facility-administered medications for this visit.     Assessment & Plan    Hyperlipidemia Assessment: Patient with ASCVD and familial hyperlipidemia not at LDL goal of < 55 Most recent LDL 188 on 11/06/23 Not able to tolerate statins secondary to brain fog Reviewed options for lowering LDL cholesterol, including ezetimibe, PCSK-9 inhibitors, bempedoic acid and inclisiran.  Discussed mechanisms of action, dosing, side effects, potential decreases in LDL cholesterol and costs.  Also reviewed potential options for patient assistance.  Plan: Patient agreeable to starting Repatha 140 mg q14d Repeat labs after:  3 months Lipid Liver function Patient has already met $2000 OOP on prescriptions for 2025.   Phillips Hay, PharmD CPP [facility] [address]  [place], [place] [address] [phone]  01/11/2024, 10:11 AM

## 2024-01-22 ENCOUNTER — Encounter: Payer: Self-pay | Admitting: Pharmacist Clinician (PhC)/ Clinical Pharmacy Specialist

## 2024-01-24 ENCOUNTER — Other Ambulatory Visit (HOSPITAL_COMMUNITY): Payer: Self-pay

## 2024-01-24 ENCOUNTER — Telehealth: Payer: Self-pay

## 2024-01-24 MED ORDER — REPATHA SURECLICK 140 MG/ML ~~LOC~~ SOAJ: 140.0000 mg | SUBCUTANEOUS | 3 refills | Status: DC

## 2024-01-24 NOTE — Addendum Note (Signed)
 Addended by: Fleetwood Pierron L on: 01/24/2024 01:27 PM   Modules accepted: Orders

## 2024-01-24 NOTE — Telephone Encounter (Signed)
 PA request has been Submitted. New Encounter has been or will be created for follow up. For additional info see Pharmacy Prior Auth telephone encounter from 01/24/24.

## 2024-01-24 NOTE — Telephone Encounter (Signed)
 Pharmacy Patient Advocate Encounter  Received notification from [employer] that Prior Authorization for REPATHA  has been APPROVED from 10/04/23 to 10/02/24. Ran test claim, Copay is $462.34 (DEDUCTIBLE). This test claim was processed through [facility]- copay amounts may vary at other pharmacies due to pharmacy/plan contracts, or as the patient moves through the different stages of their insurance plan.

## 2024-01-24 NOTE — Telephone Encounter (Signed)
 Patient states she can cover the deductible.  Will send rx to [facility]

## 2024-01-24 NOTE — Telephone Encounter (Signed)
 Pharmacy Patient Advocate Encounter   Received notification from Physician's Office that prior authorization for REPATHA  is required/requested.   Insurance verification completed.   The patient is insured through [place] .   Per test claim: PA required; PA submitted to above mentioned insurance via CoverMyMeds Key/confirmation #/EOC [id number] Status is pending

## 2024-02-05 ENCOUNTER — Other Ambulatory Visit: Payer: Self-pay | Admitting: Cardiology

## 2024-06-06 ENCOUNTER — Telehealth: Payer: Self-pay | Admitting: Cardiology

## 2024-06-06 MED ORDER — METOPROLOL TARTRATE 25 MG PO TABS: 25.0000 mg | ORAL_TABLET | Freq: Two times a day (BID) | ORAL | 1 refills | Status: AC

## 2024-06-06 NOTE — Telephone Encounter (Signed)
 Pt's medication was sent to pt's pharmacy as requested. Confirmation received.

## 2024-06-06 NOTE — Telephone Encounter (Signed)
*  STAT* If patient is at the pharmacy, call can be transferred to refill team.   1. Which medications need to be refilled? (please list name of each medication and dose if known)   metoprolol  tartrate (LOPRESSOR ) 25 MG tablet     2. Would you like to learn more about the convenience, safety, & potential cost savings by using the [facility]? No   3. Are you open to using the [facility] ([facility]) No   4. Which pharmacy/location (including street and city if local pharmacy) is medication to be sent to? [id number] - [place], [place] - [address]    5. Do they need a 30 day or 90 day supply? 90 day  Pt is out of medication

## 2024-07-03 ENCOUNTER — Other Ambulatory Visit: Payer: Self-pay | Admitting: Cardiology

## 2024-07-25 ENCOUNTER — Other Ambulatory Visit: Payer: Self-pay | Admitting: Cardiology

## 2024-07-29 ENCOUNTER — Telehealth: Payer: Self-pay | Admitting: Cardiology

## 2024-07-29 MED ORDER — LOSARTAN POTASSIUM 100 MG PO TABS: 100.0000 mg | ORAL_TABLET | Freq: Every day | ORAL | 0 refills | Status: DC

## 2024-07-29 NOTE — Telephone Encounter (Signed)
 Refill sent in

## 2024-07-29 NOTE — Telephone Encounter (Signed)
*  STAT* If patient is at the pharmacy, call can be transferred to refill team.   1. Which medications need to be refilled? (please list name of each medication and dose if known) losartan  (COZAAR ) 100 MG tablet    2. Would you like to learn more about the convenience, safety, & potential cost savings by using the [facility]? No   3. Are you open to using the [facility] ([facility]. No   4. Which pharmacy/location (including street and city if local pharmacy) is medication to be sent to?[facility] - [place], [place] - [address]    5. Do they need a 30 day or 90 day supply? 90 day   Pt scheduled 10/30/23

## 2024-09-14 ENCOUNTER — Other Ambulatory Visit: Payer: Self-pay | Admitting: Cardiology

## 2024-10-05 ENCOUNTER — Other Ambulatory Visit: Payer: Self-pay | Admitting: Cardiology

## 2024-10-09 ENCOUNTER — Encounter: Payer: Self-pay | Admitting: Cardiology

## 2024-10-09 ENCOUNTER — Other Ambulatory Visit: Payer: Self-pay

## 2024-10-09 MED ORDER — LOSARTAN POTASSIUM 100 MG PO TABS: 100.0000 mg | ORAL_TABLET | Freq: Every day | ORAL | 1 refills | Status: AC

## 2024-10-29 ENCOUNTER — Ambulatory Visit: Attending: Cardiology | Admitting: Cardiology

## 2024-10-29 ENCOUNTER — Encounter: Payer: Self-pay | Admitting: Cardiology

## 2024-10-29 ENCOUNTER — Ambulatory Visit: Admitting: Gastroenterology

## 2024-10-29 ENCOUNTER — Encounter: Payer: Self-pay | Admitting: Gastroenterology

## 2024-10-29 VITALS — BP 108/60 | HR 72 | Ht 67.0 in | Wt 161.1 lb

## 2024-10-29 VITALS — BP 140/82 | HR 71 | Ht 67.0 in | Wt 160.0 lb

## 2024-10-29 DIAGNOSIS — Z860100 Personal history of colon polyps, unspecified: Secondary | ICD-10-CM | POA: Diagnosis not present

## 2024-10-29 DIAGNOSIS — R197 Diarrhea, unspecified: Secondary | ICD-10-CM

## 2024-10-29 DIAGNOSIS — I341 Nonrheumatic mitral (valve) prolapse: Secondary | ICD-10-CM | POA: Insufficient documentation

## 2024-10-29 DIAGNOSIS — Z800 Family history of malignant neoplasm of digestive organs: Secondary | ICD-10-CM

## 2024-10-29 DIAGNOSIS — I10 Essential (primary) hypertension: Secondary | ICD-10-CM | POA: Diagnosis not present

## 2024-10-29 DIAGNOSIS — R0609 Other forms of dyspnea: Secondary | ICD-10-CM | POA: Diagnosis not present

## 2024-10-29 DIAGNOSIS — I493 Ventricular premature depolarization: Secondary | ICD-10-CM | POA: Diagnosis not present

## 2024-10-29 NOTE — Patient Instructions (Signed)
 Stop Magnesium and Multivitamins for one week, then you can add back the multivitamins.  Call us  in one week with an update.  If you have PVC's you can take Magnesium 100mg .  If you still have problems we will continue the work up.  _______________________________________________________  If your blood pressure at your visit was 140/90 or greater, please contact your primary care physician to follow up on this.  _______________________________________________________  If you are age 44 or older, your body mass index should be between 23-30. Your Body mass index is 25.24 kg/m. If this is out of the aforementioned range listed, please consider follow up with your Primary Care Provider.  If you are age 29 or younger, your body mass index should be between 19-25. Your Body mass index is 25.24 kg/m. If this is out of the aformentioned range listed, please consider follow up with your Primary Care Provider.   ________________________________________________________  The [facility] providers would like to encourage you to use MYCHART to communicate with providers for non-urgent requests or questions.  Due to long hold times on the telephone, sending your provider a message by [facility] may be a faster and more efficient way to get a response.  Please allow 48 business hours for a response.  Please remember that this is for non-urgent requests.  _______________________________________________________  [facility] is using a team-based approach to care.  Your team is made up of your doctor and two to three APPS. Our APPS (Nurse Practitioners and Physician Assistants) work with your physician to ensure care continuity for you. They are fully qualified to address your health concerns and develop a treatment plan. They communicate directly with your gastroenterologist to care for you. Seeing the Advanced Practice Practitioners on your physician's team can help you by facilitating care more  promptly, often allowing for earlier appointments, access to diagnostic testing, procedures, and other specialty referrals.

## 2024-10-29 NOTE — Progress Notes (Unsigned)
 " Cardiology Office Note:    Date:  10/29/2024   ID:  Erica Small, DOB 1956/04/16, MRN [id number]  PCP:  Jefferey Fitch, MD  Cardiologist:  Lamar Fitch, MD    Referring MD: Jefferey Fitch, MD   Chief Complaint  Patient presents with   Annual Exam  Doing fine  History of Present Illness:     Erica Small is a 69 y.o. female past medical history significant for diabetes type 1, mitral valve prolapse, mitral 2 moderate mitral valve regurgitation, hyperlipidemia, palpitation from a PVC, essential hypertension.  Comes today to months for follow-up, cardiac wise doing well denies having palpitations, no chest pain tightness squeezing pressure mid chest, she still goes to gym on the regular basis and exercise with no difficulties.  Her daughter is a marine scientist.  She works in [employer]  Past Medical History:  Diagnosis Date   Benign essential HTN 12/21/2015   Family history of colon cancer    Heart murmur    MVP   History of colon polyps    History of positive PPD    s/p 6 months of INH and rifampin   Hyperlipidemia 12/21/2015   IDA (iron deficiency anemia)    Mitral valve prolapse 12/06/2017   Palpitation 01/02/2017   Pulmonary fibrosis (HCC)    PVC (premature ventricular contraction) 11/30/2020   Type 1 diabetes mellitus without complication 12/21/2015    Past Surgical History:  Procedure Laterality Date   BREAST SURGERY     CHOLECYSTECTOMY     COLONOSCOPY  01/25/2017   Colonic polyps status post polypectomy. Small internal hemorrhoids   FACIAL COSMETIC SURGERY     face lift    POLYPECTOMY      Current Medications: Active Medications[1]   Allergies:   Patient has no known allergies.   Social History   Socioeconomic History   Marital status: Married    Spouse name: Not on file   Number of children: Not on file   Years of education: Not on file   Highest education level: Not on file  Occupational History   Not on file  Tobacco Use   Smoking  status: Never   Smokeless tobacco: Never  Vaping Use   Vaping status: Never Used  Substance and Sexual Activity   Alcohol use: Yes    Comment: very rare    Drug use: No   Sexual activity: Yes    Birth control/protection: Post-menopausal  Other Topics Concern   Not on file  Social History Narrative   Not on file   Social Drivers of Health   Tobacco Use: Low Risk (10/29/2024)   Patient History    Smoking Tobacco Use: Never    Smokeless Tobacco Use: Never    Passive Exposure: Not on file  Financial Resource Strain: Not on file  Food Insecurity: Low Risk (05/30/2024)   Received from [facility]   Epic    Within the past 12 months, you worried that your food would run out before you got money to buy more: Never true    Within the past 12 months, the food you bought just didn't last and you didn't have money to get more. : Never true  Transportation Needs: No Transportation Needs (05/30/2024)   Received from [facility]    In the past 12 months, has lack of reliable transportation kept you from medical appointments, meetings, work or from getting things needed for daily living? : No  Physical Activity: Not on file  Stress: Not on file  Social Connections: Not on file  Depression (EYV7-0): Not on file  Alcohol Screen: Not on file  Housing: Low Risk (05/30/2024)   Received from [facility]   Epic    What is your living situation today?: I have a steady place to live    Think about the place you live. Do you have problems with any of the following? Choose all that apply:: None/None on this list  Utilities: Low Risk (05/30/2024)   Received from [facility]   Utilities    In the past 12 months has the electric, gas, oil, or water company threatened to shut off services in your home? : No  Health Literacy: Not on file     Family History: The patient's family history includes Colon cancer (age of onset: 44) in her sister; Colon cancer (age of onset: 48) in her  mother; Colon polyps in her brother, brother, mother, and sister; Heart attack in her brother and father; Hyperlipidemia in her sister; Hypertension in her mother and sister; Stroke in her mother. There is no history of Esophageal cancer, Rectal cancer, or Stomach cancer. ROS:   Please see the history of present illness.    All 14 point review of systems negative except as described per history of present illness  EKGs/Labs/Other Studies Reviewed:         Recent Labs: 11/06/2023: ALT 18  Recent Lipid Panel    Component Value Date/Time   CHOL 295 (H) 11/06/2023 1031   TRIG 111 11/06/2023 1031   HDL 88 11/06/2023 1031   CHOLHDL 3.4 11/06/2023 1031   LDLCALC 188 (H) 11/06/2023 1031    Physical Exam:    VS:  BP (!) 140/82   Pulse 71   Ht 5' 7 (1.702 m)   Wt 160 lb (72.6 kg)   SpO2 96%   BMI 25.06 kg/m     Wt Readings from Last 3 Encounters:  10/29/24 160 lb (72.6 kg)  11/28/23 155 lb (70.3 kg)  11/06/23 155 lb (70.3 kg)     GEN:  Well nourished, well developed in no acute distress HEENT: Normal NECK: No JVD; No carotid bruits LYMPHATICS: No lymphadenopathy CARDIAC: RRR, soft holosystolic murmur grade 2 out of 6 basilar border EXTR, no rubs, no gallops RESPIRATORY:  Clear to auscultation without rales, wheezing or rhonchi  ABDOMEN: Soft, non-tender, non-distended MUSCULOSKELETAL:  No edema; No deformity  SKIN: Warm and dry LOWER EXTREMITIES: no swelling NEUROLOGIC:  Alert and oriented x 3 PSYCHIATRIC:  Normal affect   ASSESSMENT:    1. Benign essential HTN   2. Mitral valve prolapse   3. PVC (premature ventricular contraction)    PLAN:    In order of problems listed above:  Benign essential hypertension slightly elevated but she just took her medications today.  Will continue monitoring. Type 1 diabetes, last hemoglobin A1c 6.1 continue monitoring. Mitral valve prolapse will repeat echocardiogram soft murmur on the physical exam noted.  I warned her about  signs and symptoms of worsening of the problem she will let us  know if she develops. PVCs denies having any. Dyslipidemia she is on Repatha , will call primary care physician to get fasting lipid profile   Medication Adjustments/Labs and Tests Ordered: Current medicines are reviewed at length with the patient today.  Concerns regarding medicines are outlined above.  Orders Placed This Encounter  Procedures   EKG 12-Lead   Medication changes: No orders of the defined types were placed in this encounter.  Signed, Lamar DOROTHA Fitch, MD, [facility] 10/29/2024 9:20 AM    [facility]    [1]  Current Meds  Medication Sig   Accu-Chek FastClix Lancets MISC 1 each by Other route daily.   acebutolol  (SECTRAL ) 200 MG capsule TAKE 1 CAPSULE TWICE DAILY   ALPRAZolam (XANAX) 0.25 MG tablet Take 1 tablet by mouth 3 (three) times daily as needed for anxiety.   aspirin EC 81 MG tablet Take 81 mg by mouth daily.   Cholecalciferol (VITAMIN D) 125 MCG (5000 UT) CAPS Take 5,000 Units by mouth daily.    Continuous Blood Gluc Transmit (DEXCOM G6 TRANSMITTER) MISC 1 each by Other route daily. Use as directed for continuous glucose monitoring. Reuse transmitter for 90 days then discard and replace   escitalopram (LEXAPRO) 10 MG tablet Take 10 mg by mouth daily.   estradiol (ESTRACE) 0.5 MG tablet Take 0.5 tablets by mouth daily.   Evolocumab  (REPATHA  SURECLICK) 140 MG/ML SOAJ Inject 140 mg into the skin every 14 (fourteen) days.   glucose blood (CONTOUR NEXT TEST) test strip 1 each by Other route as needed for other (Glucose check).   Insulin  Human (INSULIN  PUMP) SOLN Inject into the skin. Novlog Insulin  given by pump   LANTUS 100 UNIT/ML injection Inject 40 units subcutaneous once a day, if unable to wear insulin  pump   losartan  (COZAAR ) 100 MG tablet Take 1 tablet (100 mg total) by mouth daily.   metoprolol  tartrate (LOPRESSOR ) 25 MG tablet Take 1 tablet (25 mg total) by mouth 2 (two)  times daily.   MILK THISTLE PO Take 1 tablet by mouth daily.   Multiple Vitamin (MULTIVITAMIN) tablet Take 1 tablet by mouth daily.   NOVOLOG 100 UNIT/ML injection Inject 1 mL into the skin as directed.   OLIVE LEAF PO Take 1 tablet by mouth daily.   OVER THE COUNTER MEDICATION Take 1 tablet by mouth daily. Organic Greens BID   OVER THE COUNTER MEDICATION Take 1 tablet by mouth daily. ALL SEASON SUPPORT   progesterone (PROMETRIUM) 200 MG capsule Take 200 mg by mouth daily.    SM MAGNESIUM CITRATE PO Take 500 mg by mouth daily.   "

## 2024-10-29 NOTE — Progress Notes (Signed)
 "   Chief Complaint: Diarrhea  Referring Provider:  Jefferey Fitch, MD      ASSESSMENT AND PLAN;   #1. Diarrhea (likely d/t Mg)  #2. H/O polyps/FH CRC (mom at age 69, sis at age 42)  Plan: -Stop Mg and MVI x 1 week, then can add MVI -Call in 1 week. -If PVCs, then can take Mg at a lower dose (100mg ) -if still with problems, then further WU. -Rpt colon due 2030.  Certainly earlier, if she starts having any new problems.   HPI:    History of Present Illness Erica Small is a 69 year old female with a history of type 1 diabetes, HTN, history of colon polyps, mitral valve prolapse, mitral regurgitation, HLD, history of palpitations, FH CRC as above, previous cholecystectomy who presents for evaluation of chronic diarrhea.  Diarrhea has persisted for 1-2 months, beginning between Thanksgiving and Christmas. She experiences 4 to 7 loose stools each morning, with the first stool more formed after starting pre- and probiotics. The remainder of the day is typically without diarrhea, and there are no nocturnal symptoms. Digestive enzymes have helped make the first stool more formed. Morning use of Pepto Bismol provides some relief. No gastrointestinal bleeding reported.  No nocturnal symptoms.  Cholestyramine prescribed by Dr. Jefferey did not improve symptoms, even after the dose was doubled. No stool testing has been performed. Family history is notable for colon cancer in her mother and sister, both diagnosed at a similar age. Colonoscopies in 2018, 2021, and 2025; the most recent revealed a small precancerous polyp, which was removed. History of hemorrhoids and small diverticula noted on colonoscopy.  Current medications include magnesium 500 mg nightly for premature ventricular contractions and multivitamins, both taken for years without recent changes. Repatha  was added last year for cholesterol management due to statin intolerance. She is unsure if magnesium helped her PVCs but has  continued it as previously recommended. Prior hospitalization for PVCs and evaluation by multiple cardiologists. Thyroid  function was normal five months ago.  No nausea, vomiting, heartburn, regurgitation, odynophagia or dysphagia.  No significant constipation.  No melena or hematochezia. No unintentional weight loss. No abdominal pain.    Wt Readings from Last 3 Encounters:  10/29/24 161 lb 2 oz (73.1 kg)  10/29/24 160 lb (72.6 kg)  11/28/23 155 lb (70.3 kg)      Past GI H/O Colon 11/28/2023: 6 mm polyp s/p polypectomy, mild div, hoids. Bx- TA. Rpt 5 yrs. Colonoscopy 04/2020: - One 8 mm polyp in the proximal transverse colon, removed with a cold snare. Resected and retrieved. - Mild sigmoid diverticulosis. - Otherwise normal colonoscopy to TI. Colonoscopy 01/2017 ([facility]): 1 cm polyp status post polypectomy, mild sigmoid diverticulosis. Past Medical History:  Diagnosis Date   Benign essential HTN 12/21/2015   Family history of colon cancer    Heart murmur    MVP   History of colon polyps    History of positive PPD    s/p 6 months of INH and rifampin   Hyperlipidemia 12/21/2015   IDA (iron deficiency anemia)    Mitral valve prolapse 12/06/2017   Palpitation 01/02/2017   Pulmonary fibrosis (HCC)    PVC (premature ventricular contraction) 11/30/2020   Type 1 diabetes mellitus without complication 12/21/2015    Past Surgical History:  Procedure Laterality Date   BREAST SURGERY     CHOLECYSTECTOMY     COLONOSCOPY  01/25/2017   Colonic polyps status post polypectomy. Small internal hemorrhoids   FACIAL COSMETIC SURGERY  face lift    POLYPECTOMY      Family History  Problem Relation Age of Onset   Stroke Mother    Colon cancer Mother 78   Hypertension Mother    Colon polyps Mother    Heart attack Father    Hyperlipidemia Sister    Hypertension Sister    Colon cancer Sister 56   Colon polyps Sister    Heart attack Brother    Colon polyps Brother    Colon polyps  Brother    Esophageal cancer Neg Hx    Rectal cancer Neg Hx    Stomach cancer Neg Hx     Social History[1]  Current Outpatient Medications  Medication Sig Dispense Refill   Accu-Chek FastClix Lancets MISC 1 each by Other route daily.     acebutolol  (SECTRAL ) 200 MG capsule TAKE 1 CAPSULE TWICE DAILY 180 capsule 0   ALPRAZolam (XANAX) 0.25 MG tablet Take 1 tablet by mouth 3 (three) times daily as needed for anxiety.     aspirin EC 81 MG tablet Take 81 mg by mouth daily.     Cholecalciferol (VITAMIN D) 125 MCG (5000 UT) CAPS Take 5,000 Units by mouth daily.      Continuous Blood Gluc Transmit (DEXCOM G6 TRANSMITTER) MISC 1 each by Other route daily. Use as directed for continuous glucose monitoring. Reuse transmitter for 90 days then discard and replace     DIGESTIVE ENZYMES PO Take 1 tablet by mouth daily.     escitalopram (LEXAPRO) 10 MG tablet Take 10 mg by mouth daily.     estradiol (ESTRACE) 0.5 MG tablet Take 0.5 tablets by mouth daily.     Evolocumab  (REPATHA  SURECLICK) 140 MG/ML SOAJ Inject 140 mg into the skin every 14 (fourteen) days. 6 mL 3   glucose blood (CONTOUR NEXT TEST) test strip 1 each by Other route as needed for other (Glucose check).     Insulin  Human (INSULIN  PUMP) SOLN Inject into the skin. Novlog Insulin  given by pump     lactobacillus acidophilus (BACID) TABS tablet Take 1 tablet by mouth 2 (two) times daily.     LANTUS 100 UNIT/ML injection Inject 40 units subcutaneous once a day, if unable to wear insulin  pump     losartan  (COZAAR ) 100 MG tablet Take 1 tablet (100 mg total) by mouth daily. 90 tablet 1   metoprolol  tartrate (LOPRESSOR ) 25 MG tablet Take 1 tablet (25 mg total) by mouth 2 (two) times daily. 180 tablet 1   MILK THISTLE PO Take 1 tablet by mouth daily.     Multiple Vitamin (MULTIVITAMIN) tablet Take 1 tablet by mouth daily.     NOVOLOG 100 UNIT/ML injection Inject 1 mL into the skin as directed.     OLIVE LEAF PO Take 1 tablet by mouth daily.      OVER THE COUNTER MEDICATION Take 1 tablet by mouth daily. Organic Greens BID     OVER THE COUNTER MEDICATION Take 1 tablet by mouth daily. ALL SEASON SUPPORT     progesterone (PROMETRIUM) 200 MG capsule Take 200 mg by mouth daily.      SM MAGNESIUM CITRATE PO Take 500 mg by mouth daily.     No current facility-administered medications for this visit.    Allergies[2]  Review of Systems:  neg     Physical Exam:    BP 108/60   Pulse 72   Ht 5' 7 (1.702 m)   Wt 161 lb 2 oz (73.1 kg)  BMI 25.24 kg/m  Wt Readings from Last 3 Encounters:  10/29/24 161 lb 2 oz (73.1 kg)  10/29/24 160 lb (72.6 kg)  11/28/23 155 lb (70.3 kg)   Constitutional:  Well-developed, in no acute distress. Psychiatric: Normal mood and affect. Behavior is normal. HEENT: Pupils normal.  Conjunctivae are normal. No scleral icterus. Cardiovascular: Normal rate, regular rhythm. No edema Pulmonary/chest: Effort normal and breath sounds normal. No wheezing, rales or rhonchi. Abdominal: Soft, nondistended. Nontender. Bowel sounds active throughout. There are no masses palpable. No hepatomegaly. Rectal: Deferred Neurological: Alert and oriented to person place and time. Skin: Skin is warm and dry. No rashes noted.  Data Reviewed: I have personally reviewed following labs and imaging studies  CBC:     No data to display          CMP:    Latest Ref Rng & Units 11/06/2023   10:31 AM 07/22/2020    1:53 PM  CMP  Glucose 65 - 99 mg/dL  820   BUN 8 - 27 mg/dL  18   Creatinine 9.42 - 1.00 mg/dL  9.22   Sodium 865 - 855 mmol/L  141   Potassium 3.5 - 5.2 mmol/L  4.6   Chloride 96 - 106 mmol/L  105   CO2 20 - 29 mmol/L  21   Calcium 8.7 - 10.3 mg/dL  9.1   AST 0 - 40 IU/L 24    ALT 0 - 32 IU/L 18       Anselm Bring, MD 10/29/2024, 1:37 PM  Cc: Jefferey Fitch, MD      [1]  Social History Tobacco Use   Smoking status: Never   Smokeless tobacco: Never  Vaping Use   Vaping status: Never Used   Substance Use Topics   Alcohol use: Yes    Comment: very rare    Drug use: No  [2] No Known Allergies  "

## 2024-10-29 NOTE — Patient Instructions (Signed)
 Medication Instructions:  Your physician recommends that you continue on your current medications as directed. Please refer to the Current Medication list given to you today.  *If you need a refill on your cardiac medications before your next appointment, please call your pharmacy*   Lab Work: None Ordered If you have labs (blood work) drawn today and your tests are completely normal, you will receive your results only by: MyChart Message (if you have MyChart) OR A paper copy in the mail If you have any lab test that is abnormal or we need to change your treatment, we will call you to review the results.   Testing/Procedures: March 2026 Your physician has requested that you have an echocardiogram. Echocardiography is a painless test that uses sound waves to create images of your heart. It provides your doctor with information about the size and shape of your heart and how well your hearts chambers and valves are working. This procedure takes approximately one hour. There are no restrictions for this procedure. Please do NOT wear cologne, perfume, aftershave, or lotions (deodorant is allowed). Please arrive 15 minutes prior to your appointment time.  Please note: We ask at that you not bring children with you during ultrasound (echo/ vascular) testing. Due to room size and safety concerns, children are not allowed in the ultrasound rooms during exams. Our front office staff cannot provide observation of children in our lobby area while testing is being conducted. An adult accompanying a patient to their appointment will only be allowed in the ultrasound room at the discretion of the ultrasound technician under special circumstances. We apologize for any inconvenience.    Follow-Up: At [facility], you and your health needs are our priority.  As part of our continuing mission to provide you with exceptional heart care, we have created designated Provider Care Teams.  These Care Teams include  your primary Cardiologist (physician) and Advanced Practice Providers (APPs -  Physician Assistants and Nurse Practitioners) who all work together to provide you with the care you need, when you need it.  We recommend signing up for the patient portal called MyChart.  Sign up information is provided on this After Visit Summary.  MyChart is used to connect with patients for Virtual Visits (Telemedicine).  Patients are able to view lab/test results, encounter notes, upcoming appointments, etc.  Non-urgent messages can be sent to your provider as well.   To learn more about what you can do with MyChart, go to [email or web address].    Your next appointment:   12 month(s)  The format for your next appointment:   In Person  Provider:   Lamar Fitch, MD    Other Instructions NA

## 2024-10-30 ENCOUNTER — Other Ambulatory Visit: Payer: Self-pay | Admitting: Cardiology

## 2024-11-03 ENCOUNTER — Encounter: Payer: Self-pay | Admitting: Cardiology

## 2024-11-05 MED ORDER — REPATHA SURECLICK 140 MG/ML ~~LOC~~ SOAJ: 140.0000 mg | SUBCUTANEOUS | 3 refills | Status: AC

## 2024-11-28 ENCOUNTER — Ambulatory Visit
